# Patient Record
Sex: Female | Born: 1985 | Hispanic: Yes | Marital: Married | Smoking: Never smoker
Health system: Southern US, Community
[De-identification: ages and names within clinical notes are randomized; demographics above are authoritative.]

## PROBLEM LIST (undated history)

## (undated) DIAGNOSIS — K819 Cholecystitis, unspecified: Secondary | ICD-10-CM

## (undated) DIAGNOSIS — O24419 Gestational diabetes mellitus in pregnancy, unspecified control: Secondary | ICD-10-CM

---

## 2015-12-11 ENCOUNTER — Other Ambulatory Visit (HOSPITAL_COMMUNITY): Payer: Self-pay | Admitting: Family Medicine

## 2015-12-11 ENCOUNTER — Ambulatory Visit
Admission: RE | Admit: 2015-12-11 | Discharge: 2015-12-11 | Disposition: A | Discharge status: Home / Self Care | Payer: Self-pay | Source: Ambulatory Visit | Attending: Family Medicine | Admitting: Family Medicine

## 2015-12-11 DIAGNOSIS — R7611 Nonspecific reaction to tuberculin skin test without active tuberculosis: Secondary | ICD-10-CM

## 2016-03-15 HISTORY — PX: CHOLECYSTECTOMY: SHX55

## 2016-06-18 DIAGNOSIS — O365911 Maternal care for other known or suspected poor fetal growth, first trimester, fetus 1: Secondary | ICD-10-CM | POA: Insufficient documentation

## 2017-01-05 ENCOUNTER — Encounter: Payer: Self-pay | Admitting: Emergency Medicine

## 2017-01-05 ENCOUNTER — Emergency Department: Payer: Self-pay

## 2017-01-05 ENCOUNTER — Emergency Department
Admission: EM | Admit: 2017-01-05 | Discharge: 2017-01-06 | Disposition: A | Discharge status: Home / Self Care | Payer: Self-pay | Attending: Emergency Medicine | Admitting: Emergency Medicine

## 2017-01-05 DIAGNOSIS — R1011 Right upper quadrant pain: Secondary | ICD-10-CM

## 2017-01-05 DIAGNOSIS — K8050 Calculus of bile duct without cholangitis or cholecystitis without obstruction: Secondary | ICD-10-CM | POA: Insufficient documentation

## 2017-01-05 DIAGNOSIS — K8020 Calculus of gallbladder without cholecystitis without obstruction: Secondary | ICD-10-CM | POA: Insufficient documentation

## 2017-01-05 HISTORY — DX: Gestational diabetes mellitus in pregnancy, unspecified control: O24.419

## 2017-01-05 LAB — COMPREHENSIVE METABOLIC PANEL
ALT: 50 U/L (ref 14–54)
AST: 92 U/L — AB (ref 15–41)
Albumin: 4.2 g/dL (ref 3.5–5.0)
Alkaline Phosphatase: 158 U/L — ABNORMAL HIGH (ref 38–126)
Anion gap: 10 (ref 5–15)
BUN: 18 mg/dL (ref 6–20)
CHLORIDE: 101 mmol/L (ref 101–111)
CO2: 27 mmol/L (ref 22–32)
Calcium: 9.4 mg/dL (ref 8.9–10.3)
Creatinine, Ser: 0.53 mg/dL (ref 0.44–1.00)
GFR calc Af Amer: >60 mL/min (ref >60)
GFR calc non Af Amer: >60 mL/min (ref >60)
Glucose, Bld: 123 mg/dL — ABNORMAL HIGH (ref 65–99)
POTASSIUM: 3.8 mmol/L (ref 3.5–5.1)
SODIUM: 138 mmol/L (ref 135–145)
Total Bilirubin: 0.4 mg/dL (ref 0.3–1.2)
Total Protein: 7.9 g/dL (ref 6.5–8.1)

## 2017-01-05 LAB — URINALYSIS, COMPLETE (UACMP) WITH MICROSCOPIC
BILIRUBIN URINE: NEGATIVE
Bacteria, UA: NONE SEEN
GLUCOSE, UA: NEGATIVE mg/dL
HGB URINE DIPSTICK: NEGATIVE
KETONES UR: NEGATIVE mg/dL
LEUKOCYTES UA: NEGATIVE
NITRITE: NEGATIVE
PH: 6 (ref 5.0–8.0)
Protein, ur: NEGATIVE mg/dL
RBC / HPF: NONE SEEN RBC/hpf (ref 0–5)
SPECIFIC GRAVITY, URINE: 1.023 (ref 1.005–1.030)
Squamous Epithelial / LPF: NONE SEEN
WBC, UA: NONE SEEN WBC/hpf (ref 0–5)

## 2017-01-05 LAB — CBC
HEMATOCRIT: 40.5 % (ref 35.0–47.0)
Hemoglobin: 13.4 g/dL (ref 12.0–16.0)
MCH: 29 pg (ref 26.0–34.0)
MCHC: 33.1 g/dL (ref 32.0–36.0)
MCV: 87.6 fL (ref 80.0–100.0)
Platelets: 288 10*3/uL (ref 150–440)
RBC: 4.63 MIL/uL (ref 3.80–5.20)
RDW: 13.4 % (ref 11.5–14.5)
WBC: 15.9 10*3/uL — AB (ref 3.6–11.0)

## 2017-01-05 LAB — POCT PREGNANCY, URINE: Preg Test, Ur: NEGATIVE

## 2017-01-05 LAB — LIPASE, BLOOD: LIPASE: 28 U/L (ref 11–51)

## 2017-01-05 NOTE — ED Triage Notes (Signed)
Pt arrived via ems from home with complaints of abdominal pain and nausea. Pt was diagnosed with "gallbladder problems" 6 years ago but states she never followed up with surgeon. Pt states Monday her back began to hurt and today her stomach started to hurt. EMS reported to a call earlier today but pt did not want to come to the ER. The pain intensified tonight prompting her arrival to the ED. PT was given 4mg  of zofran in route/.

## 2017-01-05 NOTE — ED Notes (Signed)
Patient transported to Ultrasound 

## 2017-01-05 NOTE — ED Provider Notes (Signed)
[facility] Emergency Department Provider Note  Time seen: 10:35 PM  I have reviewed the triage vital signs and the nursing notes.   HISTORY  Chief Complaint Abdominal Pain    HPI Rhonda Larson is a 31 y.o. female with no past medical history who presents to the emergency department for upper abdominal pain.  According to the patient 6 years ago she was diagnosed with possible gallbladder stones but never followed up.  States over the past 6 years she has intermittently been experiencing pain when she eats but nothing like her current episode.  She states that 3 days ago she developed upper abdominal pain which has progressively worsened until tonight when it became severe 10 out of 10.  Patient called EMS and they brought her to the emergency department.  However upon arrival she states her pain has largely diminished and currently says it is a 0/10.  Patient states nausea but denies vomiting.  Denies fever, diarrhea, dysuria.    Past Medical History:  Diagnosis Date  . Gestational diabetes     There are no active problems to display for this patient.   History reviewed. No pertinent surgical history.  Prior to Admission medications   Not on File    Not on File  No family history on file.  Social History Social History  Substance Use Topics  . Smoking status: Never Smoker  . Smokeless tobacco: Never Used  . Alcohol use No    Review of Systems Constitutional: Negative for fever. Cardiovascular: Negative for chest pain. Respiratory: Negative for shortness of breath. Gastrointestinal: Severe upper abdominal cramping leg pain, now largely resolved.  Positive for nausea but negative for vomiting or diarrhea Genitourinary: Negative for dysuria. Musculoskeletal: Negative for back pain. All other ROS negative  ____________________________________________   PHYSICAL EXAM:  VITAL SIGNS: ED Triage Vitals  Enc Vitals Group     BP  01/05/17 2200 125/77     Pulse Rate 01/05/17 2200 77     Resp 01/05/17 2200 15     Temp 01/05/17 2200 98.1 F (36.7 C)     Temp Source 01/05/17 2200 Oral     SpO2 01/05/17 2200 98 %     Weight 01/05/17 2159 133 lb (60.3 kg)     Height 01/05/17 2159 5\' 3"  (1.6 m)     Head Circumference --      Peak Flow --      Pain Score 01/05/17 2159 9     Pain Loc --      Pain Edu? --      Excl. in GC? --     Constitutional: Alert and oriented. Well appearing and in no distress. Eyes: Normal exam ENT   Head: Normocephalic and atraumatic   Mouth/Throat: Mucous membranes are moist. Cardiovascular: Normal rate, regular rhythm. No murmur Respiratory: Normal respiratory effort without tachypnea nor retractions. Breath sounds are clear Gastrointestinal: Soft, moderate epigastric and right upper quadrant tenderness to palpation.  No rebound or guarding.  No distention. Musculoskeletal: Nontender with normal range of motion in all extremities Neurologic:  Normal speech and language. No gross focal neurologic deficits Skin:  Skin is warm, dry and intact.  Psychiatric: Mood and affect are normal.  ____________________________________________    RADIOLOGY  Ultrasound pending  ____________________________________________   INITIAL IMPRESSION / ASSESSMENT AND PLAN / ED COURSE  Pertinent labs & imaging results that were available during my care of the patient were reviewed by me and considered in my medical decision  making (see chart for details).  Patient presents to the emergency department for upper abdominal pain over the past 3 days progressively worsening although now resolved.  Differential would include pancreatitis, gallbladder disease, gastritis, ulcers, colitis.  We will check labs including LFTs and lipase.  We will proceed with a right upper quadrant ultrasound.  I discussed pain control with the patient she does not wish for any pain medication at this time.  I discussed this plan  of care with the patient and she is agreeable.  Record review including her care everywhere chart is largely noncontributory to today's visit I do not see any prior record of gallbladder disease.  White blood cell count is elevated at 15,000, AST and alkaline phosphatase are also elevated.  Lipase and bilirubin are normal.  Right upper quadrant ultrasound pending, patient care signed out to Dr. Dolores FrameSung.    ____________________________________________   FINAL CLINICAL IMPRESSION(S) / ED DIAGNOSES  Upper abdominal pain    Minna AntisPaduchowski, Jisel Fleet, MD 01/05/17 2256

## 2017-01-06 MED ORDER — AMOXICILLIN-POT CLAVULANATE 875-125 MG PO TABS
ORAL_TABLET | ORAL | Status: AC
  Filled 2017-01-06: qty 1

## 2017-01-06 MED ORDER — AMOXICILLIN-POT CLAVULANATE 875-125 MG PO TABS
1.0000 | ORAL_TABLET | Freq: Once | ORAL | Status: AC
  Administered 2017-01-06: 1 via ORAL
  Filled 2017-01-06: qty 1

## 2017-01-06 MED ORDER — ONDANSETRON 4 MG PO TBDP
4.0000 mg | ORAL_TABLET | Freq: Once | ORAL | Status: AC
  Administered 2017-01-06: 4 mg via ORAL
  Filled 2017-01-06: qty 1

## 2017-01-06 MED ORDER — AMOXICILLIN-POT CLAVULANATE 875-125 MG PO TABS: 1.0000 | ORAL_TABLET | Freq: Two times a day (BID) | ORAL | 0 refills | Status: DC

## 2017-01-06 MED ORDER — HYDROCODONE-ACETAMINOPHEN 5-325 MG PO TABS
1.0000 | ORAL_TABLET | Freq: Once | ORAL | Status: AC
  Administered 2017-01-06: 1 via ORAL
  Filled 2017-01-06: qty 1

## 2017-01-06 MED ORDER — HYDROCODONE-ACETAMINOPHEN 5-325 MG PO TABS: 1.0000 | ORAL_TABLET | Freq: Four times a day (QID) | ORAL | 0 refills | Status: DC | PRN

## 2017-01-06 MED ORDER — ONDANSETRON 4 MG PO TBDP: 4.0000 mg | ORAL_TABLET | Freq: Three times a day (TID) | ORAL | 0 refills | Status: DC | PRN

## 2017-01-06 NOTE — Discharge Instructions (Signed)
1.  Take antibiotic as prescribed (Augmentin 875 mg twice daily x 7 days). 2.  You may take pain and nausea medicines as needed (Norco/Zofran #30).  Pump and dump the first ounce of your breast milk before feeding your baby if you are taking Norco. 3.  Eat a bland diet for the next week then slowly advance as tolerated. 4. Return to the ER for worsening symptoms, persistent vomiting, fever or other concerns.

## 2017-01-06 NOTE — ED Provider Notes (Signed)
-----------------------------------------   12:33 AM on 01/06/2017 -----------------------------------------  RUQ US interpreted per Dr. Jake SamplesFujinaga: Contracted gallbladder with multiple shadowing stones and mild wall  thickening but negative sonographic Murphy. No biliary dilatation.   Using interpreter services, updated patient and family members of ultrasound results.  Discussed with Dr. Aleen CampiPiscoya from general surgery who recommends adding Augmentin, discharge home with close follow-up in clinic.  Strict return precautions given.  All verbalize understanding and agree with plan of care.   Irean HongSung, Jade J, MD 01/06/17 [phone]

## 2017-01-13 ENCOUNTER — Other Ambulatory Visit: Payer: Self-pay

## 2017-01-17 ENCOUNTER — Encounter: Payer: Self-pay | Admitting: Surgery

## 2017-01-17 ENCOUNTER — Ambulatory Visit (INDEPENDENT_AMBULATORY_CARE_PROVIDER_SITE_OTHER): Payer: Self-pay | Admitting: Surgery

## 2017-01-17 VITALS — BP 112/74 | HR 66 | Temp 97.4°F | Ht 64.0 in | Wt 131.0 lb

## 2017-01-17 DIAGNOSIS — K8010 Calculus of gallbladder with chronic cholecystitis without obstruction: Secondary | ICD-10-CM

## 2017-01-17 NOTE — Patient Instructions (Signed)
Colecistectomía laparoscópica, cuidados posteriores  (Laparoscopic Cholecystectomy, Care After)  Estas indicaciones le proporcionan información acerca de cómo deberá cuidarse después del procedimiento. El médico también podrá darle instrucciones específicas. Comuníquese con el médico si tiene algún problema o tiene preguntas después del procedimiento.  CUIDADOS EN EL HOGAR  Cuidado de la incisión  · Siga las indicaciones del médico en lo que respecta al cuidado de los cortes de la cirugía (incisiones). Haga lo siguiente:  ? Lávese las manos con agua y jabón antes de cambiar las vendas (vendaje). Use un desinfectante para manos si no dispone de agua y jabón.  ? Cambie el vendaje como se lo haya indicado el médico.  ? No retire los puntos (suturas), el adhesivo para la piel o las tiras adhesivas. Tal vez deban dejarse puestos en la piel durante 2 semanas o más tiempo. Si las tiras adhesivas se despegan y se enroscan, puede recortar los bordes sueltos. No retire las tiras adhesivas por completo a menos que el médico lo autorice.  · No tome baños de inmersión, no practique natación ni use el jacuzzi hasta que el médico lo autorice. Pregúntele al médico si puede ducharse. Tal vez solo le permitan darse baños de esponja.  Instrucciones generales  · Tome los medicamentos de venta libre y los recetados solamente como se lo haya indicado el médico.  · No conduzca ni use maquinaria pesada mientras toma analgésicos recetados.  · Reanude la dieta normal como se lo haya indicado el médico.  · No levante ningún objeto que pese más de 10 libras (4,5 kg).  · No practique deportes de contacto durante 1 semana o hasta que el médico lo autorice.  SOLICITE AYUDA SI:  · Tiene enrojecimiento, hinchazón o dolor en el lugar de los cortes quirúrgicos.  · Tiene secreción de líquido, sangre o pus que emana de los cortes.  · Percibe que sale mal olor de la zona de las incisiones.  · Los cortes quirúrgicos se abren.  · Tiene fiebre.    SOLICITE  AYUDA DE INMEDIATO SI:  · Tiene una erupción cutánea.  · Tiene dificultad para respirar.  · Siente dolor en el pecho.  · Siente que el dolor en los hombros (en la zona donde van los breteles) empeora.  · Se desvanece (se desmaya) o se marea mientras está de pie.  · Tiene dolor muy intenso de vientre (abdomen).  · Tiene malestar estomacal (náuseas) o vomita durante más de 1 día.    Esta información no tiene como fin reemplazar el consejo del médico. Asegúrese de hacerle al médico cualquier pregunta que tenga.  Document Released: 11/11/2010 Document Revised: 11/20/2014 Document Reviewed: 08/18/2015  Elsevier Interactive Patient Education © 2017 [employer]vier Inc[employer]

## 2017-01-18 ENCOUNTER — Encounter: Payer: Self-pay | Admitting: Surgery

## 2017-01-18 NOTE — Progress Notes (Signed)
Patient ID: Rhonda Larson, female   DOB: 11/08/1985, [name]1 y.o.   MRN: [id number]  HPI Rhonda Larson is a 31 y.o. female date in consultation at the request of Dr. Dolores FrameSung. She reports having intermittent right upper quadrant epigastric pain for the last several years. More recently about 10 days ago had a severe episode that was severe in nature and particularly the emergency room. She reported that that pain at that time was 10 out of 10, sharp and midline located in the right upper quadrant and radiates to the back. Worsening with fatty meals. No fevers no chills no evidence of biliary obstruction. She noticed that over the last 6 years she's been having intermittent pain that gets worse with fatty meals that was not as severe as the one that occurred 10 days ago. Ultrasound personally reviewed showing a gallbladder filled with stones. Normal common bile duct. She did have an alkaline phosphatase slightly elevated at 152 and AST of 92. Her white count was 15 k. She was treated with antibiotic therapy with resolution of symptoms. She currently has no pain She is able to perform more than 4 Mets of activity without any shortness of breath or chest pain  HPI  Past Medical History:  Diagnosis Date  . Gestational diabetes     No past surgical history on file.  Family History  Problem Relation Age of Onset  . Diabetes Mother     Social History Social History   Tobacco Use  . Smoking status: Never Smoker  . Smokeless tobacco: Never Used  Substance Use Topics  . Alcohol use: No  . Drug use: No    No Known Allergies  Current Outpatient Medications  Medication Sig Dispense Refill  . ondansetron (ZOFRAN ODT) 4 MG disintegrating tablet Take 1 tablet (4 mg total) by mouth every 8 (eight) hours as needed for nausea or vomiting. 30 tablet 0   No current facility-administered medications for this visit.      Review of Systems Full ROS  was asked and was negative except for  the information on the HPI  Physical Exam Blood pressure 112/74, pulse 66, temperature (!) 97.4 F (36.3 C), temperature source Oral, height 5\' 4"  (1.626 m), weight 59.4 kg (131 lb), unknown if currently breastfeeding. CONSTITUTIONAL: NAD EYES: Pupils are equal, round, and reactive to light, Sclera are non-icteric. EARS, NOSE, MOUTH AND THROAT: The oropharynx is clear. The oral mucosa is pink and moist. Hearing is intact to voice. LYMPH NODES:  Lymph nodes in the neck are normal. RESPIRATORY:  Lungs are clear. There is normal respiratory effort, with equal breath sounds bilaterally, and without pathologic use of accessory muscles. CARDIOVASCULAR: Heart is regular without murmurs, gallops, or rubs. GI: The abdomen is  soft, nontender, and nondistended. There are no palpable masses. There is no hepatosplenomegaly. There are normal bowel sounds in all quadrants. GU: Rectal deferred.   MUSCULOSKELETAL: Normal muscle strength and tone. No cyanosis or edema.   SKIN: Turgor is good and there are no pathologic skin lesions or ulcers. NEUROLOGIC: Motor and sensation is grossly normal. Cranial nerves are grossly intact. PSYCH:  Oriented to person, place and time. Affect is normal.  Data Reviewed  I have personally reviewed the patient's imaging, laboratory findings and medical records.    Assessment/Plan Chronic cholecystitis. Discussed with the patient in detail about my recommendation for cholecystectomy. I will also like to obtain a repeat CMP and based on the results we will determine whether or not she  will need a gram. The risks, benefits, complications, treatment options, and expected outcomes were discussed with the patient. The possibilities of bleeding, recurrent infection, finding a normal gallbladder, perforation of viscus organs, damage to surrounding structures, bile leak, abscess formation, needing a drain placed, the need for additional procedures, reaction to medication, pulmonary  aspiration,  failure to diagnose a condition, the possible need to convert to an open procedure, and creating a complication requiring transfusion or operation were discussed with the patient. The patient and/or family concurred with the proposed plan, giving informed consent.  A copy of the report will be sent to referring provider   Sterling Bigiego Akeelah Seppala, MD FACS General Surgeon 01/18/2017, 12:20 PM

## 2017-01-24 ENCOUNTER — Telehealth: Payer: Self-pay

## 2017-01-24 NOTE — Telephone Encounter (Signed)
Called patient to let her know that her Pre-Admit appointment is tomorrow 01/25/2017 at 10:15 AM at [facility] [address]. I told patient that an interpreter was going to be there to help her. Patient understood and had no questions.

## 2017-01-25 ENCOUNTER — Encounter
Admission: RE | Admit: 2017-01-25 | Discharge: 2017-01-25 | Disposition: A | Discharge status: Home / Self Care | Payer: Self-pay | Source: Ambulatory Visit | Attending: Surgery | Admitting: Surgery

## 2017-01-25 ENCOUNTER — Other Ambulatory Visit: Payer: Self-pay

## 2017-01-25 HISTORY — DX: Cholecystitis, unspecified: K81.9

## 2017-01-25 NOTE — Pre-Procedure Instructions (Signed)
Claretha CooperLoyda, spanish interpreter was here to accomodate the language needs of this patient. Her husband will also need an interpreter on the day of surgery. Questions answered and patient left comfortable with the information provided.

## 2017-01-25 NOTE — Patient Instructions (Signed)
Your procedure is scheduled on: Wednesday, February 02, 2017  Report to [facility]LO[facility]  To find out your arrival time please call [phone] between 1PM - 3PM on Tuesday, February 01, 2017 Remember: Instructions that are not followed completely may result in serious medical risk, up to and including death, or upon the discretion of your surgeon and anesthesiologist your surgery may need to be rescheduled.     _X__ 1. Do not eat food after midnight the night before your procedure.                 No gum chewing or hard candies. You may drink clear liquids up to 2 hours                 before you are scheduled to arrive for your surgery- DO not drink clear                 liquids within 2 hours of the start of your surgery.                 Clear Liquids include:  water, apple juice without pulp, clear carbohydrate                 drink such as Clearfast of Gartorade, Black Coffee or Tea (Do not add                 anything to coffee or tea).     _X__ 2.  No Alcohol for 24 hours before or after surgery.   _X__ 3.  Do Not Smoke or use e-cigarettes For 24 Hours Prior to Your Surgery.                 Do not use any chewable tobacco products for at least 6 hours prior to                 surgery.  ____  4.  Bring all medications with you on the day of surgery if instructed.   __X__  5.  Notify your doctor if there is any change in your medical condition      (cold, fever, infections).     Do not wear jewelry, make-up, hairpins, clips or nail polish. Do not wear lotions, powders, or perfumes. You may wear deodorant. Do not shave 48 hours prior to surgery. Men may shave face and neck. Do not bring valuables to the hospital.    [facility] is not responsible for any belongings or valuables.  Contacts, dentures or bridgework may not be worn into surgery. Leave your suitcase in the car. After surgery it may be brought to your room. For patients  admitted to the hospital, discharge time is determined by your treatment team.   Patients discharged the day of surgery will not be allowed to drive home.   Please read over the following fact sheets that you were given:   PREPARING FOR SURGERY          ____ Take these medicines the morning of surgery with A SIP OF WATER:    1. NONE!!  :):)  2.   3.   4.  5.  6.  ____ Fleet Enema (as directed)   __X__ Use CHG Soap as directed  MAKE SURE TO HAVE STOOL SOFTENERS AT HOME. YOU WILL TAKE THESE ALONG WITH PAIN MEDICATION (IT CAN BE VERY CONSTIPATING)

## 2017-01-31 ENCOUNTER — Telehealth: Payer: Self-pay | Admitting: Surgery

## 2017-01-31 ENCOUNTER — Telehealth: Payer: Self-pay

## 2017-01-31 NOTE — Telephone Encounter (Signed)
Patient applied for financial assistance and she was denied. She wants to cancel her surgery due to funds. She will call us in the future once she is able to finish paying her ED bills.

## 2017-01-31 NOTE — Telephone Encounter (Signed)
Patient has called and stated that she would like to cancel her surgery-Laparoscopic cholecystectomy with Dr Everlene FarrierPabon on 02/02/17 due to her not being approved [facility]. She will call back and reschedule once she reapplies.

## 2017-02-02 ENCOUNTER — Ambulatory Visit: Admission: RE | Admit: 2017-02-02 | Payer: Self-pay | Source: Ambulatory Visit | Admitting: Surgery

## 2017-02-02 ENCOUNTER — Encounter: Admission: RE | Payer: Self-pay | Source: Ambulatory Visit

## 2017-02-02 SURGERY — LAPAROSCOPIC CHOLECYSTECTOMY
Anesthesia: General

## 2017-06-02 IMAGING — CR DG CHEST 1V
1 series · 1 of 1 positions shown · non-contrast
Comparison: None.

CLINICAL DATA: 30-year-old who is approximately 10 weeks pregnant,
presenting with 1 week history of cough. Positive tuberculin skin
testing.

EXAM:
CHEST 1 VIEW

[w chest pa]
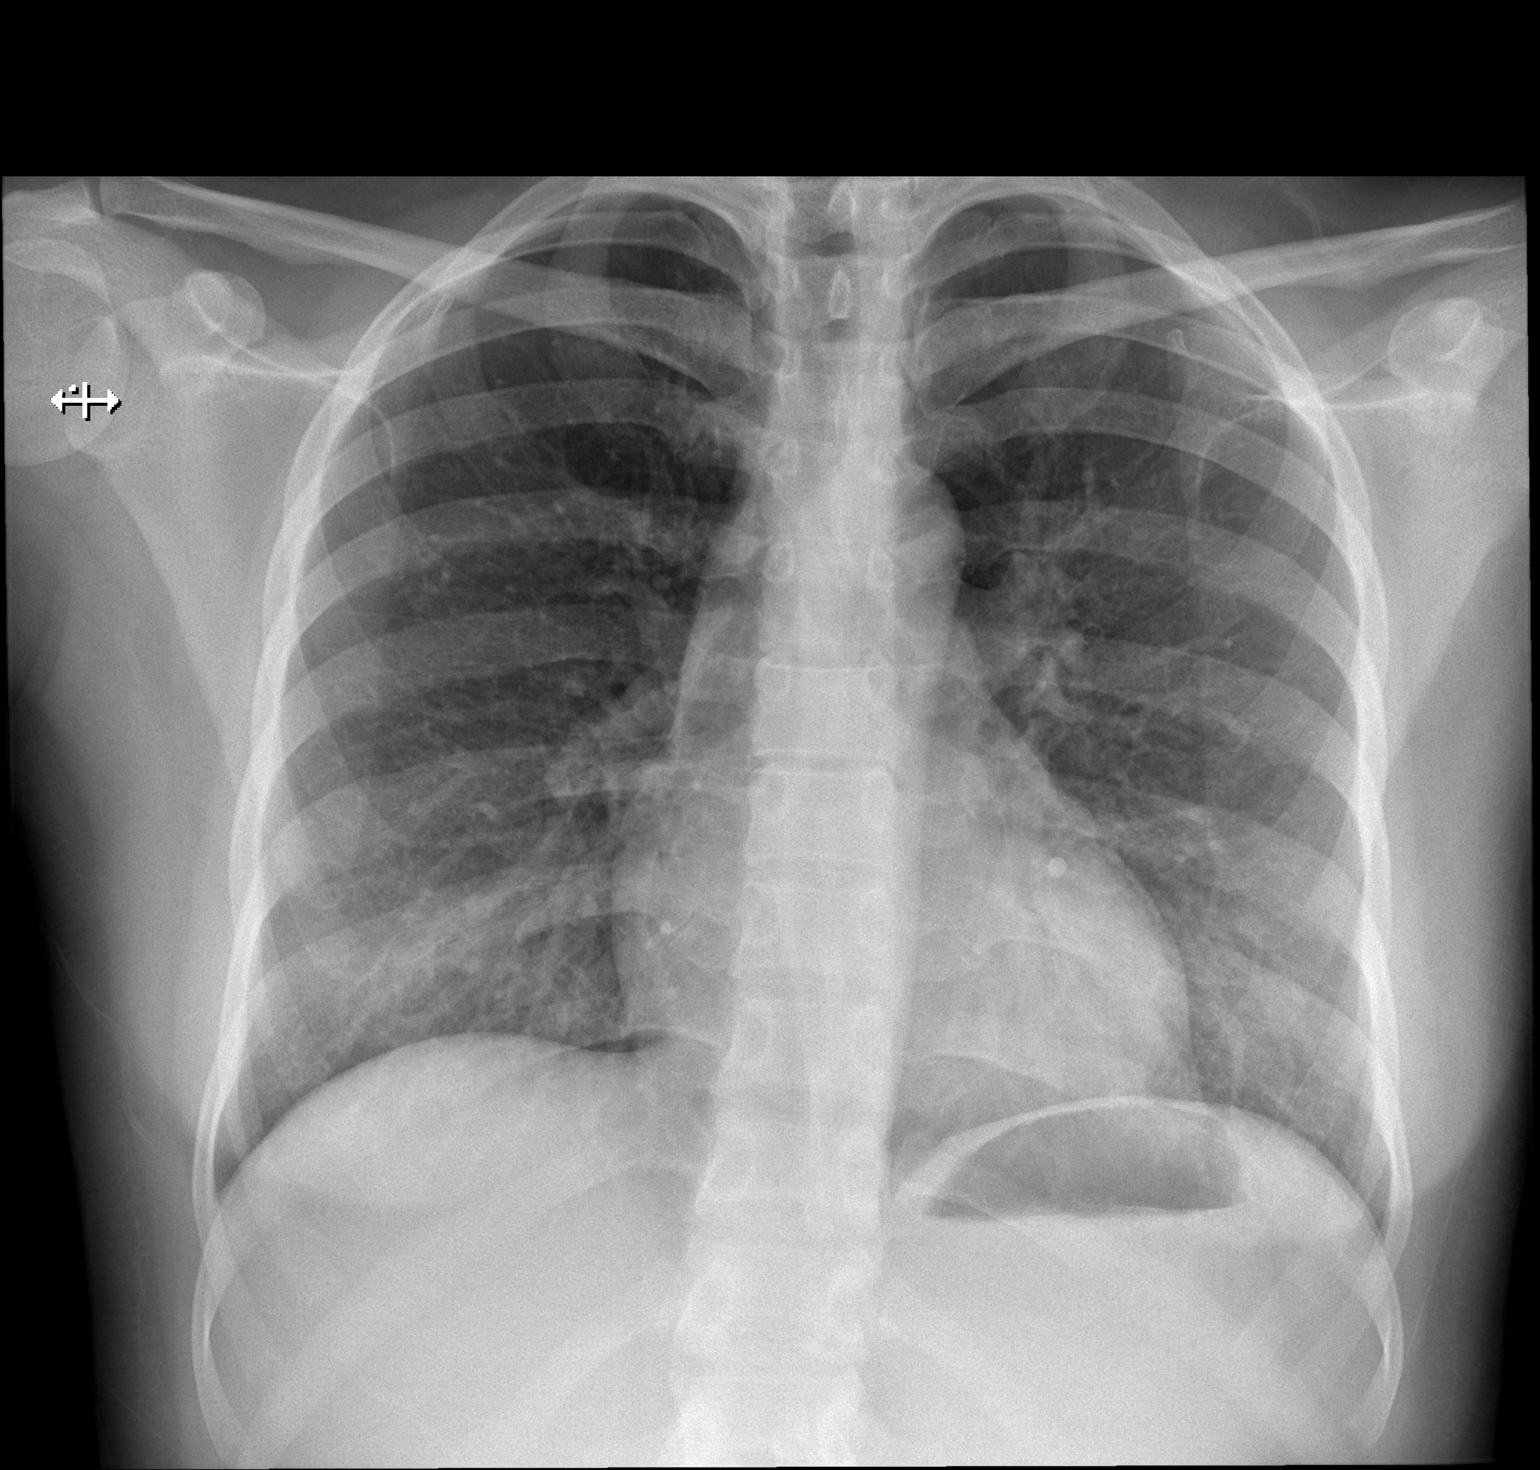

[1 of 1 positions shown; findings below may reference images not displayed]

FINDINGS: The patient was double shielded for the examination.
Cardiomediastinal silhouette unremarkable. Lungs clear.
Bronchovascular markings normal. Pulmonary vascularity normal. No
visible pleural effusions. No pneumothorax.
IMPRESSION: No acute cardiopulmonary disease.

## 2019-08-03 ENCOUNTER — Telehealth: Payer: Self-pay | Admitting: Family Medicine

## 2019-08-03 IMAGING — US US ABDOMEN LIMITED
1 series · 14 of 25 positions shown · non-contrast
Comparison: None.

CLINICAL DATA: Right upper quadrant pain with nausea

EXAM:
ULTRASOUND ABDOMEN LIMITED RIGHT UPPER QUADRANT

[Series 1: us abdomen limited · 0.19mm/px · 14 of 37 slices shown]
[im 1/37]
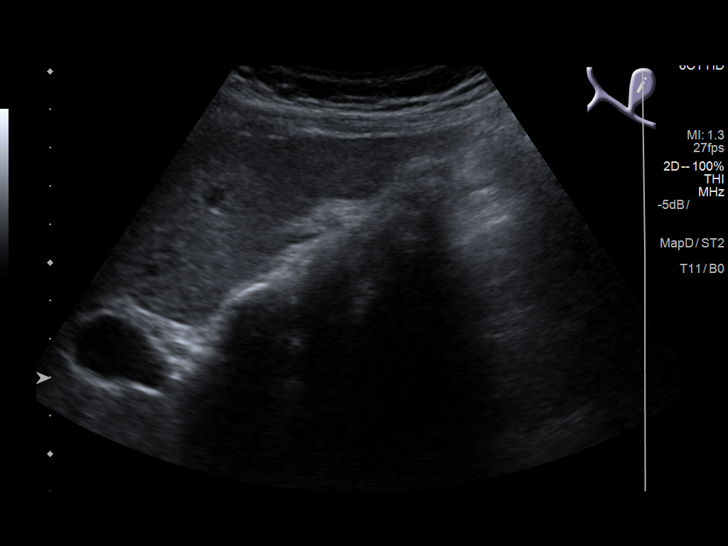
[im 4/37]
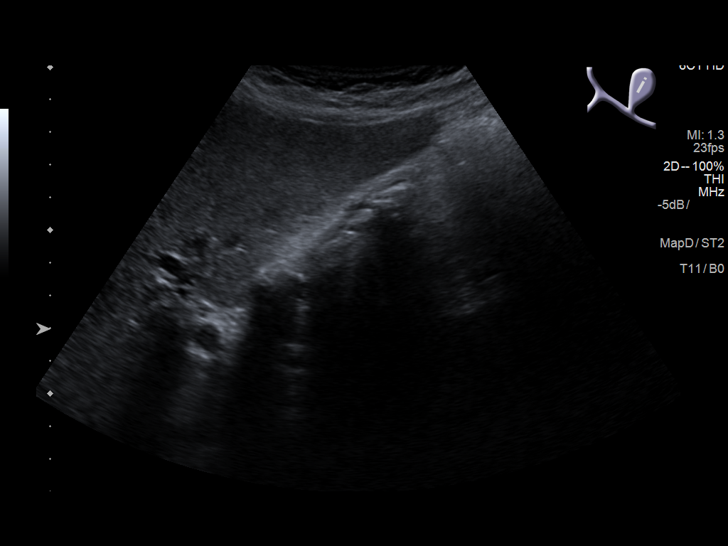
[im 7/37]
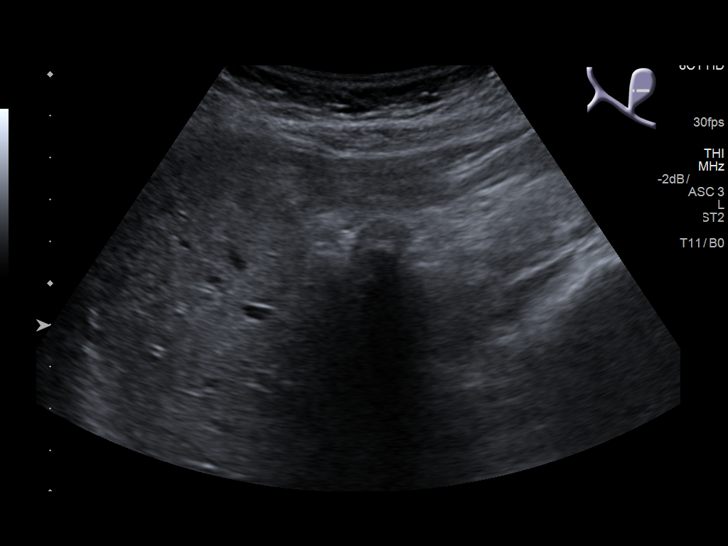
[im 10/37]
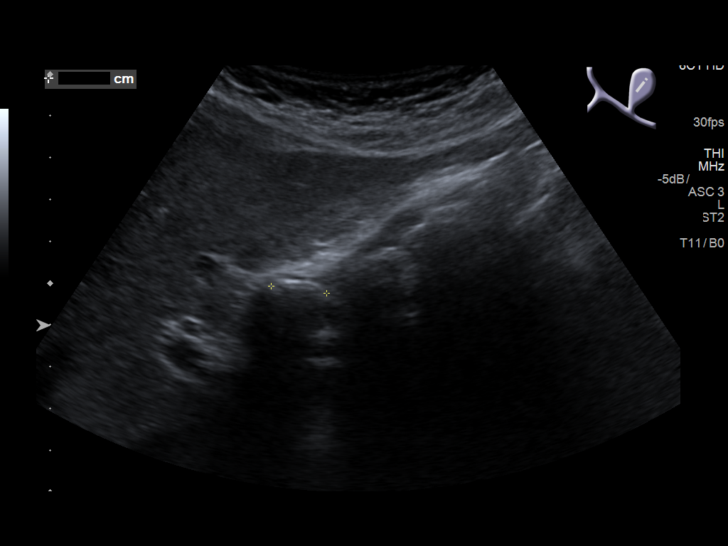
[im 13/37]
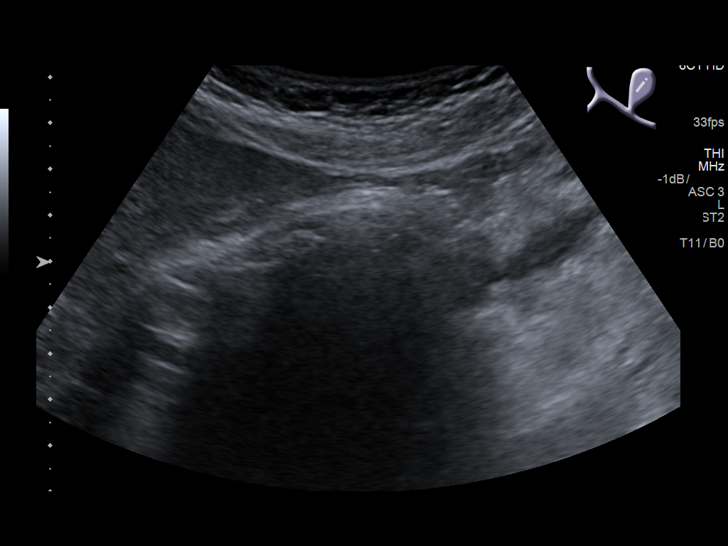
[im 14/37]
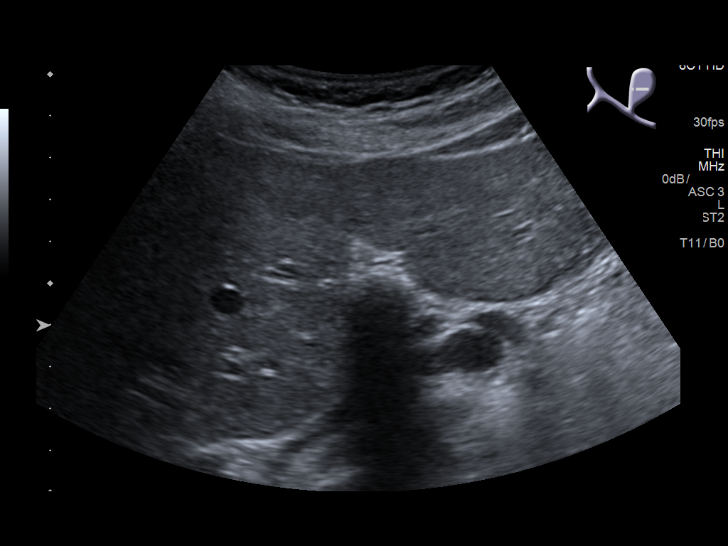
[im 17/37]
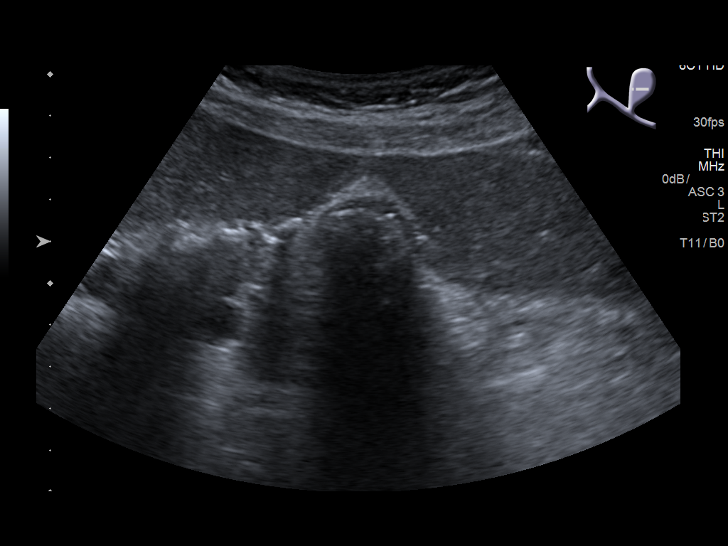
[im 20/37]
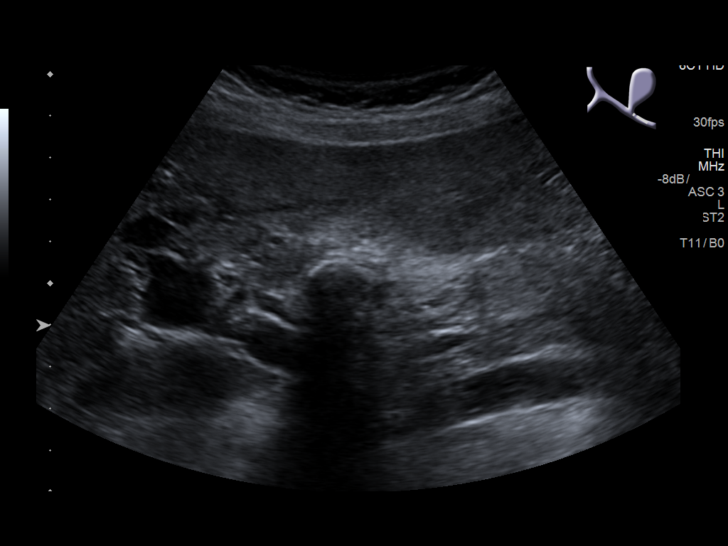
[im 23/37]
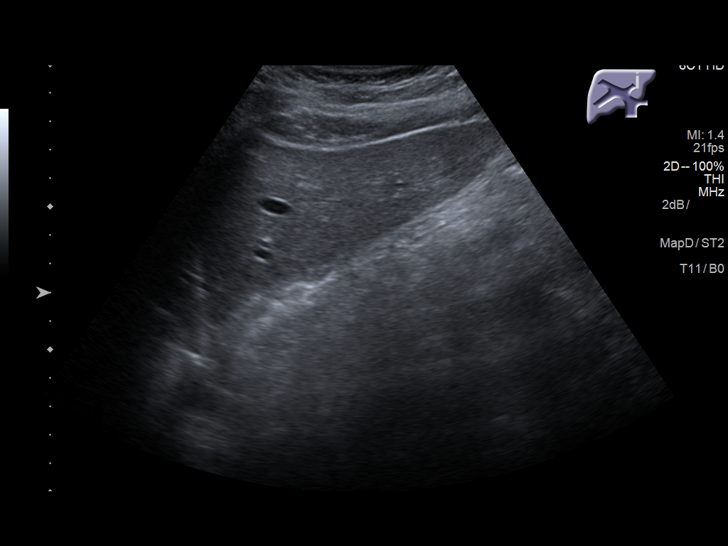
[im 25/37]
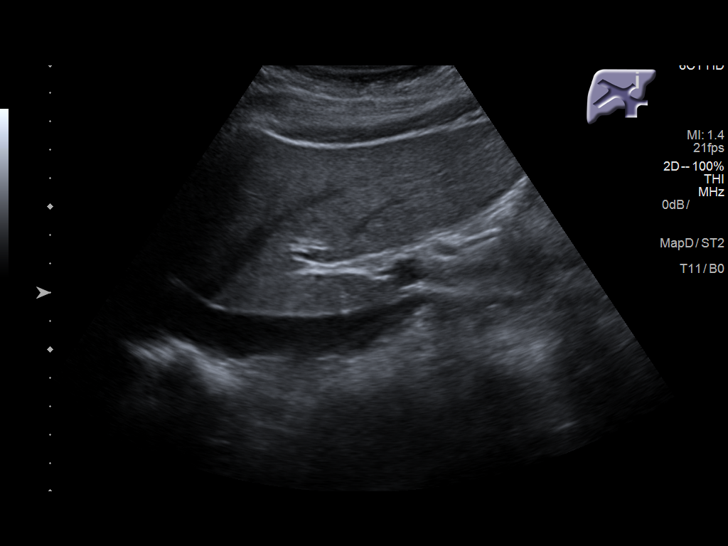
[im 28/37]
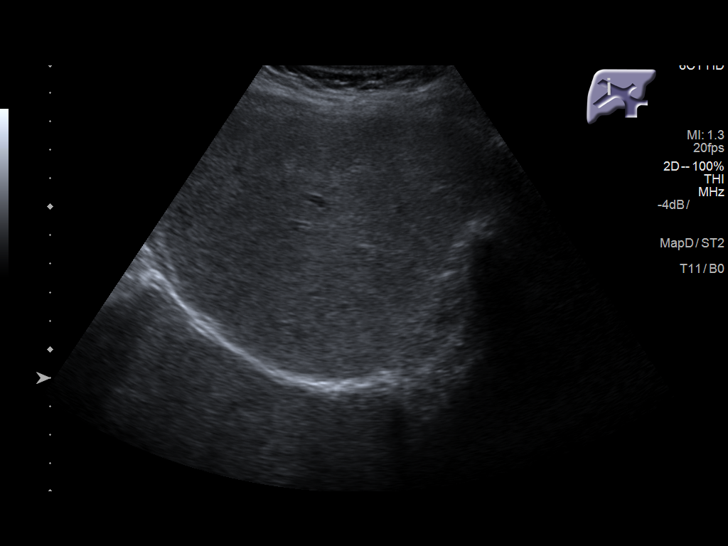
[im 31/37]
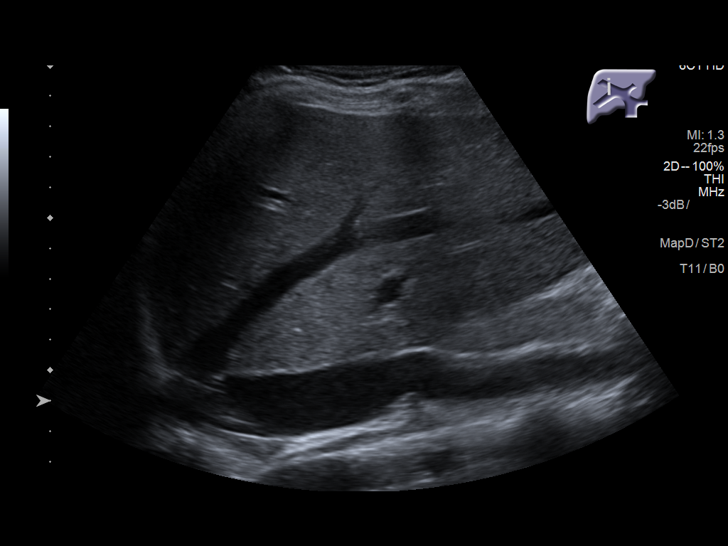
[im 34/37]
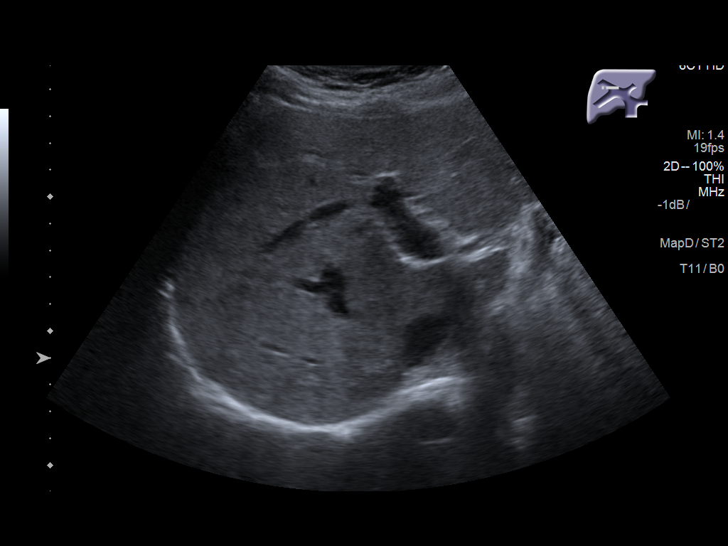
[im 37/37]
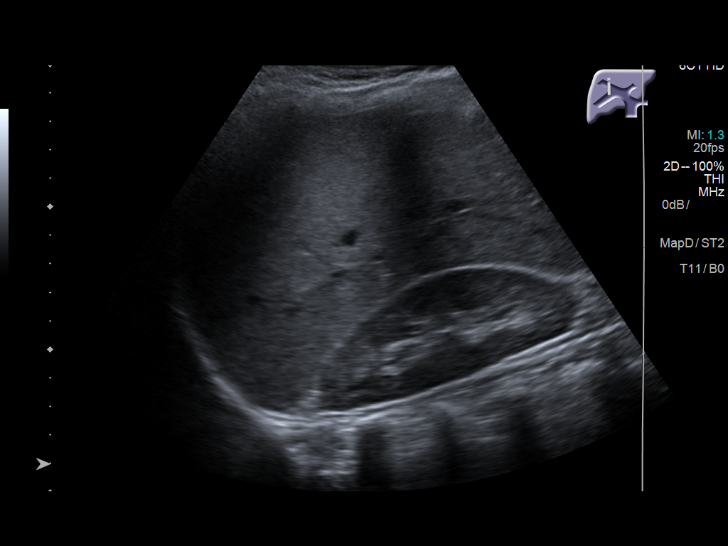

[14 of 25 positions shown; findings below may reference images not displayed]

FINDINGS: Gallbladder:

Contracted gallbladder filled with shadowing stones measuring up to
13 mm. Mild wall thickening at 4.2 mm. Negative sonographic Murphy.

Common bile duct:

Diameter: 2.9 mm

Liver:

No focal lesion identified. Within normal limits in parenchymal
echogenicity. Portal vein is patent on color Doppler imaging with
normal direction of blood flow towards the liver.
IMPRESSION: Contracted gallbladder with multiple shadowing stones and mild wall
thickening but negative sonographic Murphy. No biliary dilatation.

## 2019-08-03 NOTE — Telephone Encounter (Signed)
Patient would like Nexplanon removed and reinsertion.

## 2019-08-06 NOTE — Telephone Encounter (Signed)
Phone call to pt with interpreter Marlene Yemen. Left message on voicemail that RN with [facility] is returning phone call, if still need assistance please call [phone].

## 2019-08-07 NOTE — Telephone Encounter (Signed)
Phone call to pt with interpreter Marlene Yemen. Left message on voicemail that RN with [facility] is returning phone call, can call [phone] if still need assistance.

## 2019-08-07 NOTE — Telephone Encounter (Signed)
Phone call to pt with interpreter Salli Real. Left message on voicemail that RN with [facility] is returning phone call, if still need assistance call [phone].

## 2019-08-29 ENCOUNTER — Telehealth: Payer: Self-pay | Admitting: Family Medicine

## 2019-08-29 NOTE — Telephone Encounter (Signed)
patient needs nexplanon removed an reinserted and physical. Prefers calls in the afternoon. Needs interpreter

## 2019-08-30 NOTE — Telephone Encounter (Signed)
TC to patient with interpreter, V. Olmedo. Patient wants Nexplanon removal/reinsertion. Nexplanon inserted 08/10/2016 per [facility]. Patient's last PE was also 07/2016. Patient counseled she will need PE with appointment, and next Pap due 2022, per [facility] records. Patient scheduled for 09/07/2019 in 40 minute appointment slot for PE and removal/reinsertion of nexplanon.Burt Knack, RN

## 2019-09-07 ENCOUNTER — Ambulatory Visit (LOCAL_COMMUNITY_HEALTH_CENTER): Payer: Self-pay | Admitting: Advanced Practice Midwife

## 2019-09-07 ENCOUNTER — Other Ambulatory Visit: Payer: Self-pay

## 2019-09-07 ENCOUNTER — Encounter: Payer: Self-pay | Admitting: Advanced Practice Midwife

## 2019-09-07 VITALS — BP 100/62 | Ht 63.0 in | Wt 136.6 lb

## 2019-09-07 DIAGNOSIS — Z8632 Personal history of gestational diabetes: Secondary | ICD-10-CM | POA: Insufficient documentation

## 2019-09-07 DIAGNOSIS — Z30017 Encounter for initial prescription of implantable subdermal contraceptive: Secondary | ICD-10-CM

## 2019-09-07 DIAGNOSIS — Z3046 Encounter for surveillance of implantable subdermal contraceptive: Secondary | ICD-10-CM

## 2019-09-07 DIAGNOSIS — Z3009 Encounter for other general counseling and advice on contraception: Secondary | ICD-10-CM

## 2019-09-07 LAB — WET PREP FOR TRICH, YEAST, CLUE
Trichomonas Exam: NEGATIVE
Yeast Exam: NEGATIVE

## 2019-09-07 MED ORDER — ETONOGESTREL 68 MG ~~LOC~~ IMPL
68.0000 mg | DRUG_IMPLANT | Freq: Once | SUBCUTANEOUS | Status: AC
  Administered 2019-09-07: 17:00:00 68 mg via SUBCUTANEOUS

## 2019-09-07 NOTE — Progress Notes (Signed)
Wet mount reviewed and is negative today so no treatment needed for wet mount per standing order. PCP list given to pt. Provider orders completed.

## 2019-09-07 NOTE — Progress Notes (Signed)
Family Planning Visit- Initial Visit  Subjective:  Rhonda Larson is a 34 y.o. MHF nonsmoker G3P3000   being seen today for an initial well woman visit and to have Nexplanon removed and reinserted.  Rhonda Larson is currently using *Nexplanon for pregnancy prevention. Patient reports Rhonda Larson does not want a pregnancy in the next year.  Patient has the following medical conditions does not have any active problems on file.  Chief Complaint  Patient presents with  . Contraception    Physical, Nexplanon removal and reinsertion    Patient reports Nexplanon inserted 08/10/2016.  Last sex 09/03/19 with condom; with current partner x 14 years.  LMP 08/31/19.  Hx gestational diabetes 2018.  Not employed.  Last pap 06/19/2015 neg.  Last PE 07/2016.  Patient denies sxs of vaginitis but c/o vaginal pressure relieved with rest  Body mass index is 24.2 kg/m. - Patient is eligible for diabetes screening based on BMI and age >55?  no HA1C ordered? not applicable  Patient reports 1 of partners in last year. Desires STI screening?  No - no  Has patient been screened once for HCV in the past?  No  No results found for: HCVAB  Does the patient have current of drug use, have a partner with drug use, and/or has been incarcerated since last result? No  If yes-- Screen for HCV through [facility]   Does the patient meet criteria for HBV testing? No  Criteria:  -Household, sexual or needle sharing contact with HBV -History of drug use -HIV positive -Those with known Hep C   Health Maintenance Due  Topic Date Due  . Hepatitis C Screening  Never done  . HIV Screening  Never done  . PAP SMEAR-Modifier  Never done    Review of Systems  Genitourinary: Positive for frequency (x past 3 years; no dysuia).  Musculoskeletal: Positive for joint pain (left knee hurts x 4 months).  All other systems reviewed and are negative.   The following portions of the patient's history were reviewed and updated as  appropriate: allergies, current medications, past family history, past medical history, past social history, past surgical history and problem list. Problem list updated.   See flowsheet for other program required questions.  Objective:   Vitals:   09/07/19 1455  BP: 100/62  Weight: 136 lb 9.6 oz (62 kg)  Height: 5\' 3"  (1.6 m)    Physical Exam Constitutional:      Appearance: Normal appearance. Rhonda Larson is normal weight.  HENT:     Head: Normocephalic and atraumatic.     Mouth/Throat:     Mouth: Mucous membranes are moist.  Eyes:     Conjunctiva/sclera: Conjunctivae normal.  Cardiovascular:     Rate and Rhythm: Normal rate and regular rhythm.  Pulmonary:     Effort: Pulmonary effort is normal.     Breath sounds: Normal breath sounds.  Chest:     Breasts:        Right: Normal.        Left: Normal.  Abdominal:     General: Abdomen is flat.     Palpations: Abdomen is soft.     Comments: Soft without tenderness, good tone  Genitourinary:    General: Normal vulva.     Exam position: Lithotomy position.     Vagina: Vaginal discharge (white creamy leukorrhea, ph<4.5) present.     Cervix: Normal.     Uterus: Normal.      Adnexa: Right adnexa normal and left adnexa  normal.     Rectum: Normal.  Musculoskeletal:        General: Normal range of motion.     Cervical back: Normal range of motion and neck supple.  Skin:    General: Skin is warm and dry.  Neurological:     Mental Status: Rhonda Larson is alert.  Psychiatric:        Mood and Affect: Mood normal.       Assessment and Plan:  Charline Hoskinson is a 34 y.o. female presenting to the [facility] Department for an initial well woman exam/family planning visit  Contraception counseling: Reviewed all forms of birth control options in the tiered based approach. available including abstinence; over the counter/barrier methods; hormonal contraceptive medication including pill, patch, ring, injection,contraceptive  implant, ECP; hormonal and nonhormonal IUDs; permanent sterilization options including vasectomy and the various tubal sterilization modalities. Risks, benefits, and typical effectiveness rates were reviewed.  Questions were answered.  Written information was also given to the patient to review.  Patient desires Nexplanon, this was prescribed for patient. Rhonda Larson will follow up in prn for surveillance.  Rhonda Larson was told to call with any further questions, or with any concerns about this method of contraception.  Emphasized use of condoms 100% of the time for STI prevention.  Patient was offered ECP. ECP was not accepted by the patient. ECP counseling was not given - see RN documentation  1. Family planning Treat wet mount per standing orders Immunization nurse consult Please give list of primary care MD list to pt - etonogestrel (NEXPLANON) implant 68 mg - WET PREP FOR TRICH, YEAST, CLUE  2. Encounter for surveillance of implantable subdermal contraceptive Nexplanon Removal and Insertion  Patient identified, informed consent performed, consent signed.   Patient does understand that irregular bleeding is a very common side effect of this medication. Rhonda Larson was advised to have backup contraception for one week after replacement of the implant. Patient deemed to meet WHO criteria for being reasonably certain Rhonda Larson is not pregnant.  Appropriate time out taken. Nexplanon site identified. Area prepped in usual sterile fashon. 3 ml of 1% lidocaine with epinephrine was used to anesthetize the area at the distal end of the implant. A small stab incision was made right beside the implant on the distal portion. The Nexplanon rod was grasped using hemostats and removed without difficulty. There was minimal blood loss. There were no complications.   Confirmed correct location of insertion site. The insertion site was identified 8-10 cm (3-4 inches) from the medial epicondyle of the humerus and 3-5 cm (1.25-2 inches) posterior  to (below) the sulcus (groove) between the biceps and triceps muscles of the patient's left arm. New Nexplanon removed from packaging, Device confirmed in needle, then inserted full length of needle and withdrawn per handbook instructions. Nexplanon was able to palpated in the patient's left arm; patient palpated the insert herself.  There was minimal blood loss. Patient insertion site covered with guaze and a pressure bandage to reduce any bruising. The patient tolerated the procedure well and was given post procedure instructions.       Return if symptoms worsen or fail to improve.  No future appointments.  Alberteen Spindle, CNM

## 2019-09-07 NOTE — Progress Notes (Signed)
Pt here for physical, Nexplanon removal and reinsertion. Per centricity records Nexplanon was placed 08/10/2016. Pt reports using condoms with all sex for the past 2-3 weeks. RN counseling for Nexplanon removal, reinsertion completed and consent forms reviewed and signed by pt, and questions answered.

## 2019-11-07 ENCOUNTER — Telehealth: Payer: Self-pay | Admitting: Family Medicine

## 2019-11-08 NOTE — Telephone Encounter (Addendum)
Returned call to pt at phone # provided with language line interpreter [phone]). Pt had a Nexplanon placed 09/07/2019 and pt reports that she has had menstrual bleeding since a week after it was placed and is still having menstrual bleeding now. Pt reports menstrual flow is regular and that she goes through about 3 pads in a day. Pt denies any further symptoms. Counseled pt that I will speak with a provider to see what they would recommend for pt and will give pt a call back. Pt states understanding and that she is busy now, but will be available for a call back at around 4:30pm. Consulted with Landry Dyke, PA-C regarding conversation with pt and per provider pt can try Ibuprofen 600mg  every 6 hours with food for 5 days, and if it does not get better to give a call back so she can RTC. Will give pt a call back around 4:30pm per pt request to let pt know regarding [place], PA-C's recommendations.

## 2019-11-08 NOTE — Telephone Encounter (Signed)
Returned call to pt at phone # provided with interpretor from language line Lissa Hoard [phone]). Counseled pt per [facility] Streilein's, PA-C recommendations and pt states understanding. Pt states that she has an appt Tuesday 11/13/2019. Counseled pt that is fine if she keeps appt especially if the menstrual bleeding does not go away, but to give [place] a call if symptoms change or get worse and pt states understanding. Pt with no further questions at this time.

## 2019-11-13 ENCOUNTER — Ambulatory Visit: Payer: Self-pay

## 2019-11-13 ENCOUNTER — Other Ambulatory Visit: Payer: Self-pay

## 2019-11-13 ENCOUNTER — Ambulatory Visit (LOCAL_COMMUNITY_HEALTH_CENTER): Payer: Self-pay | Admitting: Advanced Practice Midwife

## 2019-11-13 ENCOUNTER — Encounter: Payer: Self-pay | Admitting: Advanced Practice Midwife

## 2019-11-13 VITALS — BP 103/67 | Ht 63.0 in | Wt 135.8 lb

## 2019-11-13 DIAGNOSIS — Z3009 Encounter for other general counseling and advice on contraception: Secondary | ICD-10-CM

## 2019-11-13 DIAGNOSIS — Z3046 Encounter for surveillance of implantable subdermal contraceptive: Secondary | ICD-10-CM

## 2019-11-13 NOTE — Progress Notes (Signed)
34 yo MHF G3P3 with Nexplanon removed and reinserted 09/07/19.  Pt called stating bleeding since 1 week after reinsertion and was told to take Ibuprofen 600 mg q 6 hrs x 5 days with no relief.  States brown d/c less than a menses but red when she voids.  Last PE 09/07/19.  Last pap 06/17/15 neg.  Discussed via interpreter, that this is normal and to be expected for 4-5 months after insertion. Pt reassured

## 2019-11-13 NOTE — Progress Notes (Signed)
Here today for complaint of continuous vaginal bleeding since insertion of Nexplanon 09/07/2019. Patient states she started having vaginal bleeding "a week after my appt." Has been taking Ibuprofen 600 mg Q6h x 5 days per nursing recommendations. Last PE here was 09/07/2019. Last Pap Smear was 06/17/2015. Per Cent[facility]ity next Pap due 2022. Tawny Hopping, RN

## 2020-08-07 ENCOUNTER — Telehealth: Payer: Self-pay | Admitting: Family Medicine

## 2020-08-07 NOTE — Telephone Encounter (Signed)
Spoke with Rhonda Larson.  Pt needs to be on a MVI and if she is not taking Ibuprofen, she can take 600 mg every 6 hours or 800 mg every 8 hours.  Pt needs to make a f/u appointment.  [facility] [employer] called and left a message for pt to return my call

## 2020-08-07 NOTE — Telephone Encounter (Signed)
Patient stated that she has been bleeding nonstop for awhile now and she doesn't think it's normal. Last time she came for the same reason and was told it was normal because of the nexplanon she's on. Patient also mention she has been feeling weak and dizzy. She said she doesn't want her nexplanon to be removed but wants something else done or given for her bleeding to stop.

## 2020-08-08 ENCOUNTER — Other Ambulatory Visit: Payer: Self-pay

## 2020-08-08 ENCOUNTER — Emergency Department: Payer: Self-pay

## 2020-08-08 ENCOUNTER — Emergency Department
Admission: EM | Admit: 2020-08-08 | Discharge: 2020-08-08 | Disposition: A | Discharge status: Home / Self Care | Payer: Self-pay | Attending: Emergency Medicine | Admitting: Emergency Medicine

## 2020-08-08 DIAGNOSIS — R42 Dizziness and giddiness: Secondary | ICD-10-CM | POA: Insufficient documentation

## 2020-08-08 DIAGNOSIS — R102 Pelvic and perineal pain: Secondary | ICD-10-CM | POA: Insufficient documentation

## 2020-08-08 DIAGNOSIS — N939 Abnormal uterine and vaginal bleeding, unspecified: Secondary | ICD-10-CM | POA: Insufficient documentation

## 2020-08-08 LAB — URINALYSIS, ROUTINE W REFLEX MICROSCOPIC
Bacteria, UA: NONE SEEN
Bilirubin Urine: NEGATIVE
Glucose, UA: NEGATIVE mg/dL
Ketones, ur: NEGATIVE mg/dL
Leukocytes,Ua: NEGATIVE
Nitrite: NEGATIVE
Protein, ur: 30 mg/dL — AB
RBC / HPF: >50 RBC/hpf — ABNORMAL HIGH (ref 0–5)
Specific Gravity, Urine: 1.029 (ref 1.005–1.030)
WBC, UA: NONE SEEN WBC/hpf (ref 0–5)
pH: 5 (ref 5.0–8.0)

## 2020-08-08 LAB — CBC
HCT: 40.3 % (ref 36.0–46.0)
Hemoglobin: 13.8 g/dL (ref 12.0–15.0)
MCH: 30.3 pg (ref 26.0–34.0)
MCHC: 34.2 g/dL (ref 30.0–36.0)
MCV: 88.6 fL (ref 80.0–100.0)
Platelets: 247 10*3/uL (ref 150–400)
RBC: 4.55 MIL/uL (ref 3.87–5.11)
RDW: 12.4 % (ref 11.5–15.5)
WBC: 6.6 10*3/uL (ref 4.0–10.5)
nRBC: 0 % (ref 0.0–0.2)

## 2020-08-08 LAB — COMPREHENSIVE METABOLIC PANEL
ALT: 66 U/L — ABNORMAL HIGH (ref 0–44)
AST: 52 U/L — ABNORMAL HIGH (ref 15–41)
Albumin: 4.2 g/dL (ref 3.5–5.0)
Alkaline Phosphatase: 66 U/L (ref 38–126)
Anion gap: 8 (ref 5–15)
BUN: 17 mg/dL (ref 6–20)
CO2: 24 mmol/L (ref 22–32)
Calcium: 8.7 mg/dL — ABNORMAL LOW (ref 8.9–10.3)
Chloride: 104 mmol/L (ref 98–111)
Creatinine, Ser: 0.68 mg/dL (ref 0.44–1.00)
GFR, Estimated: >60 mL/min (ref >60)
Glucose, Bld: 104 mg/dL — ABNORMAL HIGH (ref 70–99)
Potassium: 3.8 mmol/L (ref 3.5–5.1)
Sodium: 136 mmol/L (ref 135–145)
Total Bilirubin: 1 mg/dL (ref 0.3–1.2)
Total Protein: 7.8 g/dL (ref 6.5–8.1)

## 2020-08-08 LAB — PREGNANCY, URINE: Preg Test, Ur: NEGATIVE

## 2020-08-08 MED ORDER — SODIUM CHLORIDE 0.9 % IV BOLUS
1000.0000 mL | Freq: Once | INTRAVENOUS | Status: AC
  Administered 2020-08-08: 1000 mL via INTRAVENOUS

## 2020-08-08 MED ORDER — ONDANSETRON HCL 4 MG PO TABS
4.0000 mg | ORAL_TABLET | Freq: Once | ORAL | Status: AC
  Administered 2020-08-08: 4 mg via ORAL
  Filled 2020-08-08: qty 1

## 2020-08-08 NOTE — ED Notes (Signed)
ED Provider at bedside. 

## 2020-08-08 NOTE — ED Notes (Addendum)
Pt states Friday she had a fever. Pt unsure of temperature stating she didn't check it. States took medication for it. Pt now also c/o throat soreness and swallowing issues x 1 year.

## 2020-08-08 NOTE — ED Notes (Signed)
ED Provider at bedside. Interpreter on tablet used for assessment.

## 2020-08-08 NOTE — ED Notes (Signed)
Patient transported to Ultrasound 

## 2020-08-08 NOTE — Discharge Instructions (Signed)
Please follow-up with your OB/GYN regarding adenomyosis. Please increase hydration at home.

## 2020-08-08 NOTE — ED Provider Notes (Signed)
[facility]-EMERGENCY DEPARTMENT  ____________________________________________  Time seen: Approximately 8:51 PM  I have reviewed the triage vital signs and the nursing notes.   HISTORY  Chief Complaint No chief complaint on file.   Historian Patient     HPI Rhonda Larson is a 35 y.o. female presents to the emergency department with vaginal bleeding for the past month.  Patient states that she has a Nexplanon for birth control and that there is no possibility of pregnancy.  She states that she has had some dizziness at home.  Patient reports occasional pelvic pain.  No chest pain, chest tightness or nausea and vomiting at home.  No similar symptoms in the past.   Past Medical History:  Diagnosis Date  . Cholecystitis   . Gestational diabetes    nothing since delivery 06/2016     Immunizations up to date:  Yes.     Past Medical History:  Diagnosis Date  . Cholecystitis   . Gestational diabetes    nothing since delivery 06/2016    Patient Active Problem List   Diagnosis Date Noted  . History of gestational diabetes 2018 09/07/2019    History reviewed. No pertinent surgical history.  Prior to Admission medications   Medication Sig Start Date End Date Taking? Authorizing Provider  amoxicillin-clavulanate (AUGMENTIN) 875-125 MG tablet Take 1 tablet 2 (two) times daily by mouth. Patient not taking: Reported on 09/07/2019    [provider]  HYDROcodone-acetaminophen (NORCO/VICODIN) 5-325 MG tablet Take 1 tablet every 6 (six) hours as needed by mouth for moderate pain. Patient not taking: Reported on 09/07/2019    [provider]  ibuprofen (ADVIL) 600 MG tablet Take 600 mg by mouth every 6 (six) hours as needed.    [provider]  ondansetron (ZOFRAN ODT) 4 MG disintegrating tablet Take 1 tablet (4 mg total) by mouth every 8 (eight) hours as needed for nausea or vomiting. Patient not taking: Reported on 09/07/2019 01/06/17   Irean Hong,  MD    Allergies Patient has no known allergies.  Family History  Problem Relation Age of Onset  . Diabetes Mother   . Hypertension Maternal Grandmother   . Kidney disease Maternal Aunt     Social History Social History   Tobacco Use  . Smoking status: Never Smoker  . Smokeless tobacco: Never Used  Vaping Use  . Vaping Use: Never used  Substance Use Topics  . Alcohol use: No  . Drug use: No     Review of Systems  Constitutional: No fever/chills Eyes:  No discharge ENT: No upper respiratory complaints. Respiratory: no cough. No SOB/ use of accessory muscles to breath Gastrointestinal:   No nausea, no vomiting.  No diarrhea.  No constipation. Genitourinary: Patient has vaginal bleeding.  Musculoskeletal: Negative for musculoskeletal pain. Skin: Negative for rash, abrasions, lacerations, ecchymosis.    ____________________________________________   PHYSICAL EXAM:  VITAL SIGNS: ED Triage Vitals  Enc Vitals Group     BP 08/08/20 1715 (!) 90/51     Pulse Rate 08/08/20 1715 71     Resp 08/08/20 1715 19     Temp 08/08/20 1715 97.8 F (36.6 C)     Temp Source 08/08/20 1715 Oral     SpO2 08/08/20 1715 99 %     Weight 08/08/20 1717 145 lb (65.8 kg)     Height 08/08/20 1717 5\' 3"  (1.6 m)     Head Circumference --      Peak Flow --  Pain Score 08/08/20 1717 0     Pain Loc --      Pain Edu? --      Excl. in GC? --      Constitutional: Alert and oriented. Well appearing and in no acute distress. Eyes: Conjunctivae are normal. PERRL. EOMI. Head: Atraumatic. ENT: Cardiovascular: Normal rate, regular rhythm. Normal S1 and S2.  Good peripheral circulation. Respiratory: Normal respiratory effort without tachypnea or retractions. Lungs CTAB. Good air entry to the bases with no decreased or absent breath sounds Gastrointestinal: Bowel sounds x 4 quadrants. Soft and nontender to palpation. No guarding or rigidity. No distention. Musculoskeletal: Full range of  motion to all extremities. No obvious deformities noted Neurologic:  Normal for age. No gross focal neurologic deficits are appreciated.  Skin:  Skin is warm, dry and intact. No rash noted. Psychiatric: Mood and affect are normal for age. Speech and behavior are normal.   ____________________________________________   LABS (all labs ordered are listed, but only abnormal results are displayed)  Labs Reviewed  COMPREHENSIVE METABOLIC PANEL - Abnormal; Notable for the following components:      Result Value   Glucose, Bld 104 (*)    Calcium 8.7 (*)    AST 52 (*)    ALT 66 (*)    All other components within normal limits  URINALYSIS, ROUTINE W REFLEX MICROSCOPIC - Abnormal; Notable for the following components:   Color, Urine YELLOW (*)    APPearance HAZY (*)    Hgb urine dipstick LARGE (*)    Protein, ur 30 (*)    RBC / HPF >50 (*)    All other components within normal limits  CBC  PREGNANCY, URINE   ____________________________________________  EKG   ____________________________________________  RADIOLOGY Rhonda Larson, personally viewed and evaluated these images (plain radiographs) as part of my medical decision making, as well as reviewing the written report by the radiologist.    US PELVIC COMPLETE WITH TRANSVAGINAL  Result Date: 08/08/2020 CLINICAL DATA:  35 year old female with irregular bleeding x1 month. EXAM: TRANSABDOMINAL AND TRANSVAGINAL ULTRASOUND OF PELVIS TECHNIQUE: Both transabdominal and transvaginal ultrasound examinations of the pelvis were performed. Transabdominal technique was performed for global imaging of the pelvis including uterus, ovaries, adnexal regions, and pelvic cul-de-sac. It was necessary to proceed with endovaginal exam following the transabdominal exam to visualize the endometrium and ovaries. COMPARISON:  None FINDINGS: Uterus Measurements: 9.7 x 4.7 x 6.5 cm = volume: 155 mL. The uterus demonstrates a heterogeneous echotexture with  findings suggestive of adenomyosis. A 9 mm probable complex nabothian cyst in the lower uterus. Endometrium Thickness: 4 mm. Slight hyperemia of the endometrium. No focal lesion identified. Hysteroscopy or saline infused hysterosonography may provide better evaluation if there is clinical concern for an endometrial lesion such as a polyp. Right ovary Measurements: 4.2 x 1.8 x 2.7 cm = volume: 10.8 mL. Normal appearance/no adnexal mass. Left ovary Measurements: 4.1 x 1.8 x 1.8 cm = volume: 7.3 mL. Normal appearance/no adnexal mass. Other findings No abnormal free fluid. IMPRESSION: 1. Heterogeneous uterus with findings suggestive of adenomyosis. 2. Slightly hyperemic endometrium without a focal mass. 3. Unremarkable ovaries. Electronically Signed   By: Elgie Collard M.D.   On: 08/08/2020 22:11    ____________________________________________    PROCEDURES  Procedure(s) performed:     Procedures     Medications  ondansetron (ZOFRAN) tablet 4 mg (4 mg Oral Given 08/08/20 1731)  sodium chloride 0.9 % bolus 1,000 mL (0 mLs Intravenous Stopped 08/08/20  2325)     ____________________________________________   INITIAL IMPRESSION / ASSESSMENT AND PLAN / ED COURSE  Pertinent labs & imaging results that were available during my care of the patient were reviewed by me and considered in my medical decision making (see chart for details).      Assessment and Plan:  35 year old female presents to the emergency department with vaginal bleeding that has occurred for 1 month.  Patient was hypertensive at triage but vital signs were otherwise reassuring.  CBC indicated hemoglobin of 13.8.  CMP showed mild elevation in AST and ALT but were otherwise reassuring.  Urinalysis showed a large amount of blood but no other findings concerning for UTI.  Pelvic ultrasound was concerning for adenomyosis.  Patient was given a liter of normal saline and she reported that she felt improved after fluids.   Recommended increase hydration at home and follow-up with her OB/GYN.  Return precautions were given to return with new or worsening symptoms.  All patient questions were answered.   ____________________________________________  FINAL CLINICAL IMPRESSION(S) / ED DIAGNOSES  Final diagnoses:  Vaginal bleeding      NEW MEDICATIONS STARTED DURING THIS VISIT:  ED Discharge Orders    None          This chart was dictated using voice recognition software/Dragon. Despite best efforts to proofread, errors can occur which can change the meaning. Any change was purely unintentional.     Orvil Feil, PA-C 08/08/20 2328    Sharyn Creamer, MD 08/09/20 0010

## 2020-08-08 NOTE — ED Triage Notes (Signed)
Pt in from home d/t issue with birth control implant in arm; states has had excessive vaginal bleeding. Weakness and chills noted. Pt denies any history of anemia. Pt reports nausea and blood clots. Denies dizziness, vomiting, cramping. Interpreting system used during triage.

## 2020-08-08 NOTE — ED Notes (Signed)
Pt returned from US

## 2020-08-08 NOTE — ED Notes (Signed)
Pt given UA cup, states she cannot pee right now

## 2022-01-12 ENCOUNTER — Other Ambulatory Visit: Payer: Self-pay

## 2022-01-12 ENCOUNTER — Emergency Department
Admission: EM | Admit: 2022-01-12 | Discharge: 2022-01-12 | Disposition: A | Discharge status: Home / Self Care | Payer: Self-pay | Attending: Emergency Medicine | Admitting: Emergency Medicine

## 2022-01-12 ENCOUNTER — Emergency Department: Payer: Self-pay

## 2022-01-12 DIAGNOSIS — M25519 Pain in unspecified shoulder: Secondary | ICD-10-CM

## 2022-01-12 DIAGNOSIS — R0602 Shortness of breath: Secondary | ICD-10-CM | POA: Insufficient documentation

## 2022-01-12 DIAGNOSIS — Z1152 Encounter for screening for COVID-19: Secondary | ICD-10-CM | POA: Insufficient documentation

## 2022-01-12 DIAGNOSIS — R5383 Other fatigue: Secondary | ICD-10-CM | POA: Insufficient documentation

## 2022-01-12 DIAGNOSIS — M25512 Pain in left shoulder: Secondary | ICD-10-CM | POA: Insufficient documentation

## 2022-01-12 DIAGNOSIS — R079 Chest pain, unspecified: Secondary | ICD-10-CM | POA: Insufficient documentation

## 2022-01-12 LAB — COMPREHENSIVE METABOLIC PANEL
ALT: 11 U/L (ref 0–44)
AST: 17 U/L (ref 15–41)
Albumin: 4.1 g/dL (ref 3.5–5.0)
Alkaline Phosphatase: 64 U/L (ref 38–126)
Anion gap: 10 (ref 5–15)
BUN: 15 mg/dL (ref 6–20)
CO2: 20 mmol/L — ABNORMAL LOW (ref 22–32)
Calcium: 8.8 mg/dL — ABNORMAL LOW (ref 8.9–10.3)
Chloride: 108 mmol/L (ref 98–111)
Creatinine, Ser: 0.49 mg/dL (ref 0.44–1.00)
GFR, Estimated: >60 mL/min (ref >60)
Glucose, Bld: 93 mg/dL (ref 70–99)
Potassium: 3.6 mmol/L (ref 3.5–5.1)
Sodium: 138 mmol/L (ref 135–145)
Total Bilirubin: 0.7 mg/dL (ref 0.3–1.2)
Total Protein: 7.3 g/dL (ref 6.5–8.1)

## 2022-01-12 LAB — URINALYSIS, ROUTINE W REFLEX MICROSCOPIC
Bilirubin Urine: NEGATIVE
Glucose, UA: NEGATIVE mg/dL
Ketones, ur: 5 mg/dL — AB
Nitrite: NEGATIVE
Protein, ur: NEGATIVE mg/dL
Specific Gravity, Urine: 1.004 — ABNORMAL LOW (ref 1.005–1.030)
pH: 5 (ref 5.0–8.0)

## 2022-01-12 LAB — CBC
HCT: 38.5 % (ref 36.0–46.0)
Hemoglobin: 12.6 g/dL (ref 12.0–15.0)
MCH: 29.4 pg (ref 26.0–34.0)
MCHC: 32.7 g/dL (ref 30.0–36.0)
MCV: 89.7 fL (ref 80.0–100.0)
Platelets: 279 10*3/uL (ref 150–400)
RBC: 4.29 MIL/uL (ref 3.87–5.11)
RDW: 12.9 % (ref 11.5–15.5)
WBC: 8.6 10*3/uL (ref 4.0–10.5)
nRBC: 0 % (ref 0.0–0.2)

## 2022-01-12 LAB — RESP PANEL BY RT-PCR (FLU A&B, COVID) ARPGX2
Influenza A by PCR: NEGATIVE
Influenza B by PCR: NEGATIVE
SARS Coronavirus 2 by RT PCR: NEGATIVE

## 2022-01-12 LAB — PREGNANCY, URINE: Preg Test, Ur: NEGATIVE

## 2022-01-12 LAB — TROPONIN I (HIGH SENSITIVITY)
Troponin I (High Sensitivity): <2 ng/L (ref <18)
Troponin I (High Sensitivity): <2 ng/L (ref <18)

## 2022-01-12 MED ORDER — CYCLOBENZAPRINE HCL 10 MG PO TABS
5.0000 mg | ORAL_TABLET | Freq: Once | ORAL | Status: AC
  Administered 2022-01-12: 5 mg via ORAL
  Filled 2022-01-12: qty 1

## 2022-01-12 MED ORDER — ACETAMINOPHEN 500 MG PO TABS
1000.0000 mg | ORAL_TABLET | Freq: Once | ORAL | Status: AC
  Administered 2022-01-12: 1000 mg via ORAL
  Filled 2022-01-12: qty 2

## 2022-01-12 MED ORDER — ALUM & MAG HYDROXIDE-SIMETH 200-200-20 MG/5ML PO SUSP
30.0000 mL | Freq: Once | ORAL | Status: AC
  Administered 2022-01-12: 30 mL via ORAL
  Filled 2022-01-12: qty 30

## 2022-01-12 NOTE — ED Provider Notes (Addendum)
[facility] Provider Note    Event Date/Time   First MD Initiated Contact with Patient 01/12/22 1911     (approximate)   History   Chest Pain   HPI  Rhonda Larson is a 36 y.o. female with no significant past medical history presents with chest pain.  Patient symptoms started around 5 PM.  She started to feel generally unwell and then started having pain in her left shoulder radiating to her left neck and chest area.  She felt short of breath with this as well.  Has had ongoing pain since that time.  Nonpleuritic.  Pain does go down the arm.  She has had belching but no nausea or vomiting.  Has had multiple similar episodes of shoulder/chest pain in the last 6 months. The patient denies hx of prior DVT/PE, unilateral leg pain/swelling, hormone use, recent surgery, hx of cancer, prolonged immobilization, or hemoptysis.      Past Medical History:  Diagnosis Date   Cholecystitis    Gestational diabetes    nothing since delivery 06/2016    Patient Active Problem List   Diagnosis Date Noted   History of gestational diabetes 2018 09/07/2019     Physical Exam  Triage Vital Signs: ED Triage Vitals  Enc Vitals Group     BP 01/12/22 1850 109/65     Pulse Rate 01/12/22 1850 88     Resp 01/12/22 1850 18     Temp 01/12/22 1850 98 F (36.7 C)     Temp Source 01/12/22 1850 Oral     SpO2 01/12/22 1850 99 %     Weight --      Height --      Head Circumference --      Peak Flow --      Pain Score 01/12/22 1849 5     Pain Loc --      Pain Edu? --      Excl. in GC? --     Most recent vital signs: Vitals:   01/12/22 1850 01/12/22 2035  BP: 109/65 116/64  Pulse: 88 75  Resp: 18 18  Temp: 98 F (36.7 C) 97.9 F (36.6 C)  SpO2: 99% 98%     General: Awake, no distress.  CV:  Good peripheral perfusion. 2+ radial pulses BL, no peripheral edema, legs are symmetric  Resp:  Normal effort. No increased wob Abd:  No distention.  Abdomen is soft  nontender Neuro:             Awake, Alert, Oriented x 3  Other:  Tenderness to palpation over the left trap and left cervical paraspinal musculature 5/5 strength with elbow flexion extension, 4-5 strength with grip on the left compared to 5 out of 5 on the right   ED Results / Procedures / Treatments  Labs (all labs ordered are listed, but only abnormal results are displayed) Labs Reviewed  COMPREHENSIVE METABOLIC PANEL - Abnormal; Notable for the following components:      Result Value   CO2 20 (*)    Calcium 8.8 (*)    All other components within normal limits  URINALYSIS, ROUTINE W REFLEX MICROSCOPIC - Abnormal; Notable for the following components:   Color, Urine STRAW (*)    APPearance HAZY (*)    Specific Gravity, Urine 1.004 (*)    Hgb urine dipstick LARGE (*)    Ketones, ur 5 (*)    Leukocytes,Ua SMALL (*)    Bacteria, UA RARE (*)  All other components within normal limits  RESP PANEL BY RT-PCR (FLU A&B, COVID) ARPGX2  CBC  PREGNANCY, URINE  POC URINE PREG, ED  TROPONIN I (HIGH SENSITIVITY)  TROPONIN I (HIGH SENSITIVITY)     EKG  EKG interpretation performed by myself: NSR, nml axis, nml intervals, flattened T wave in V3, no acute ischemic changes    RADIOLOGY I reviewed and interpreted the CXR which does not show any acute cardiopulmonary process    PROCEDURES:  Critical Care performed: No  Procedures    MEDICATIONS ORDERED IN ED: Medications  alum & mag hydroxide-simeth (MAALOX/MYLANTA) 200-200-20 MG/5ML suspension 30 mL (30 mLs Oral Given 01/12/22 2025)  cyclobenzaprine (FLEXERIL) tablet 5 mg (5 mg Oral Given 01/12/22 2025)  acetaminophen (TYLENOL) tablet 1,000 mg (1,000 mg Oral Given 01/12/22 2025)     IMPRESSION / MDM / ASSESSMENT AND PLAN / ED COURSE  I reviewed the triage vital signs and the nursing notes.                              Patient's presentation is most consistent with acute presentation with potential threat to life or  bodily function.  Differential diagnosis includes, but is not limited to, GERD, dyspepsia, musculoskeletal pain, cervical radiculopathy, less likely acute coronary syndrome, pulmonary embolism  The patient is a 36 year old female is rather healthy presents because of chest and shoulder pain.  Symptoms started around 5:00 she endorses feeling generally unwell and fatigued and pain in the left shoulder that radiated to her neck and chest.  She continues to have the pain now.  She has had this pain off and on for 6 months.  Denies any injury does endorse some shortness of breath her vitals are reassuring and she is not hypoxic or tachycardic.  She looks well and there is focal tenderness really over the trap and paraspinal cervical musculature.  There is no findings to suggest DVT.  She does have somewhat decreased strength with grip on the left but does seem to be in pain as well has good strength with elbow flexion extension.  She is frequently belching abdomen is soft nontender.  Ultimately my suspicion for cardiac cause of her symptoms is low.  Her shoulder and chest pain seem to be more musculoskeletal and the fact that this has been going on intermittently for 6 months is reassuring also she has no cardiac risk factors and is young healthy and very low heart score and her high-sensitivity troponin is undetectable.  EKG is nonischemic.  Suspect either muscle spasm versus cervical radiculopathy.  Plan to treat with Tylenol GI cocktail given the belching as there could be a component of dyspepsia or GERD could be contributing to chest pain.  Will avoid NSAIDs use there is a underlying GI cause.  Of note urinalysis was collected had 11-20 WBCs but patient is not having any urinary symptoms do not feel it represents UTI.   Clinical Course as of 01/12/22 2302  Tue Jan 12, 2022  2236 Troponin I (High Sensitivity): <2 [KM]    Clinical Course User Index [KM] Rada Hay, MD     FINAL CLINICAL  IMPRESSION(S) / ED DIAGNOSES   Final diagnoses:  Shoulder pain, unspecified chronicity, unspecified laterality  Chest pain, unspecified type     Rx / DC Orders   ED Discharge Orders     None        Note:  This document was prepared  using Conservation officer, historic buildings and may include unintentional dictation errors.   Georga Hacking, MD 01/12/22 2302    Georga Hacking, MD 01/12/22 2306

## 2022-01-12 NOTE — ED Notes (Signed)
E signature pad not working. Pt educated on discharge instructions and verbalized understanding.  

## 2022-01-12 NOTE — Discharge Instructions (Signed)
Your cardiac work-up was reassuring including EKG and heart enzymes.  I suspect that your pain is related to muscle spasm.  You also might be having digestion causing the belching.  You can take Tylenol for pain.

## 2022-01-12 NOTE — ED Provider Triage Note (Signed)
  Emergency Medicine Provider Triage Evaluation Note  Rhonda Larson , a 36 y.o.female,  was evaluated in triage.  Pt complains of chest pain.  She states she is cooking when she started having left-sided chest pain and neck pain.  She states that she has been generally feeling unwell for the past few days.  She states that she has pain whenever she pushes on the left side of her chest.  In addition, she has been having increased belching/burping.  Denies any recent injuries or illnesses.   Review of Systems  Positive: Chest pain, fatigue, burping Negative: Denies fever, abdominal pain, vomiting  Physical Exam   Vitals:   01/12/22 1850  BP: 109/65  Pulse: 88  Resp: 18  Temp: 98 F (36.7 C)  SpO2: 99%   Gen:   Awake, appears exhausted. Resp:  Normal effort  MSK:   Moves extremities without difficulty  Other:    Medical Decision Making  Given the patient's initial medical screening exam, the following diagnostic evaluation has been ordered. The patient will be placed in the appropriate treatment space, once one is available, to complete the evaluation and treatment. I have discussed the plan of care with the patient and I have advised the patient that an ED physician or mid-level practitioner will reevaluate their condition after the test results have been received, as the results may give them additional insight into the type of treatment they may need.    Diagnostics: Labs, UA, respiratory panel, CXR, EKG  Treatments: none immediately   Teodoro Spray, [place] 01/12/22 1851

## 2022-01-12 NOTE — ED Triage Notes (Signed)
Pt to ED via POV from home. Pt reports she was cooking this and started having left sided CP, neck pain and generally feeling unwell. Pt also reports increased belching.

## 2022-01-29 IMAGING — US US PELVIS COMPLETE WITH TRANSVAGINAL
1 series · 13 of 25 positions shown · non-contrast
Comparison: None

CLINICAL DATA: 35-year-old female with irregular bleeding x1 month.

EXAM:
TRANSABDOMINAL AND TRANSVAGINAL ULTRASOUND OF PELVIS
TECHNIQUE: Both transabdominal and transvaginal ultrasound examinations of the
pelvis were performed. Transabdominal technique was performed for
global imaging of the pelvis including uterus, ovaries, adnexal
regions, and pelvic cul-de-sac. It was necessary to proceed with
endovaginal exam following the transabdominal exam to visualize the
endometrium and ovaries.

[Series 1: us pelvis (transabdominal only) · 13 of 55 slices shown]
[im 1/55]
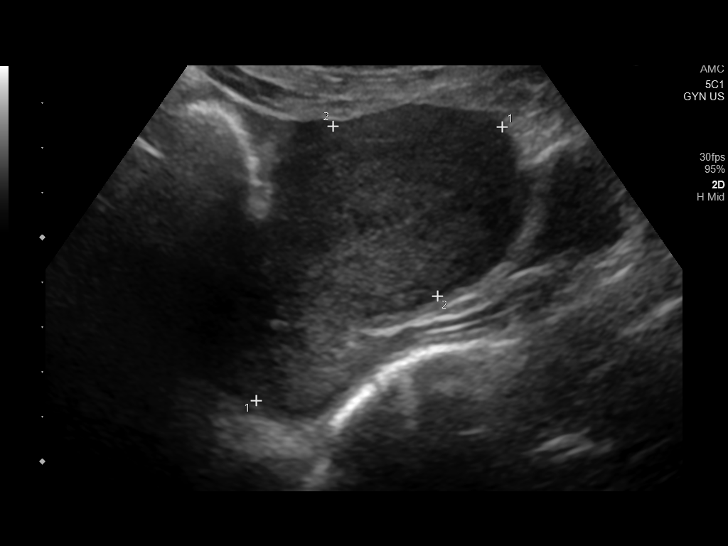
[im 5/55]
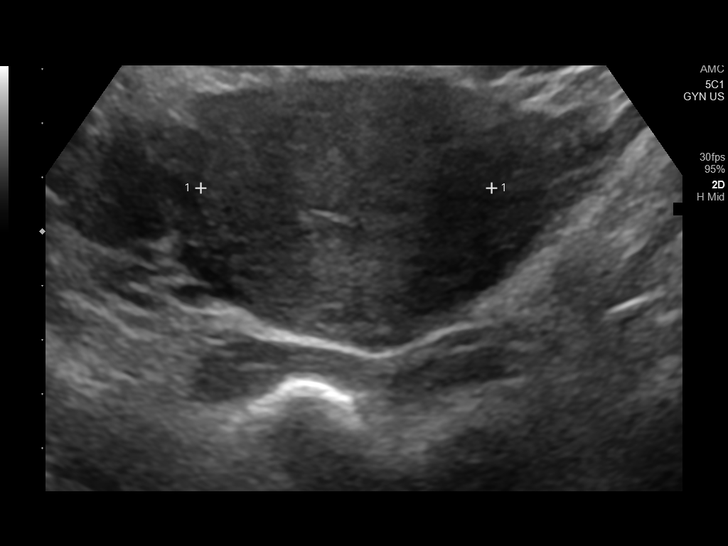
[im 10/55]
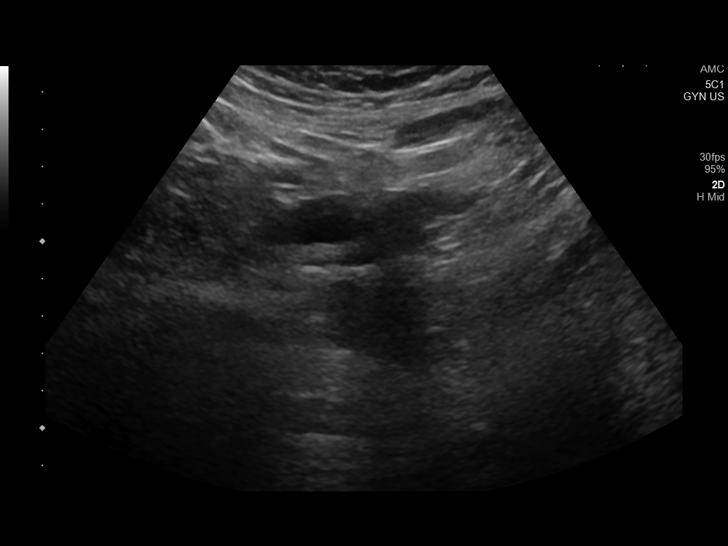
[im 14/55]
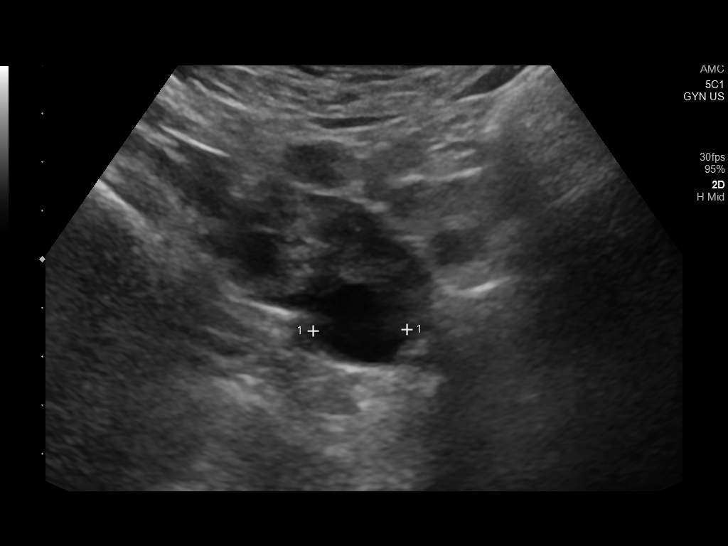
[im 19/55]
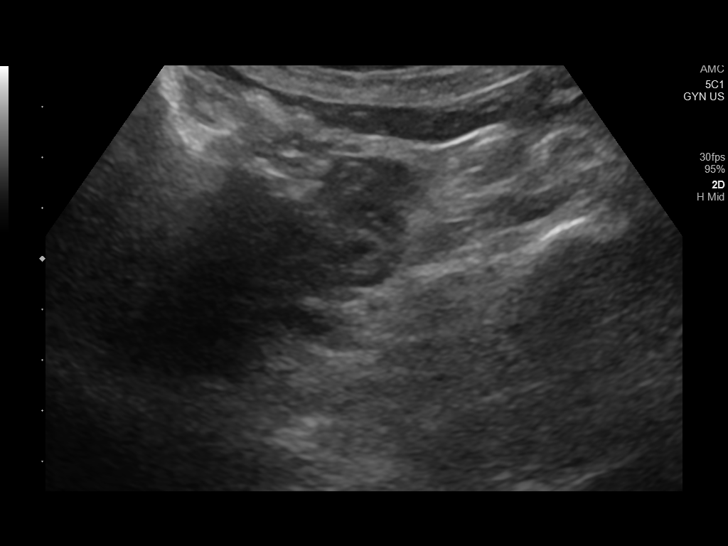
[im 23/55]
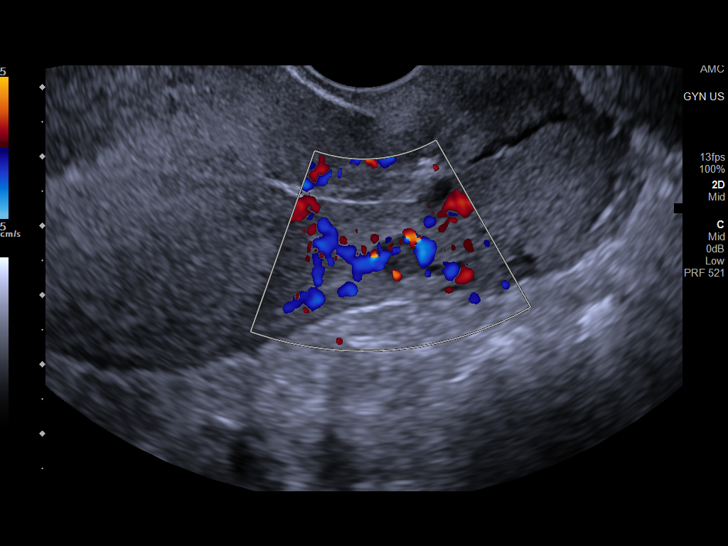
[im 28/55]
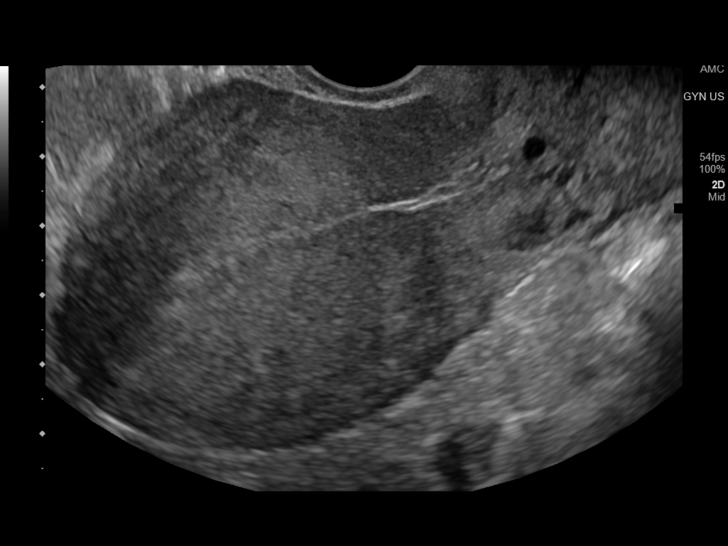
[im 32/55]
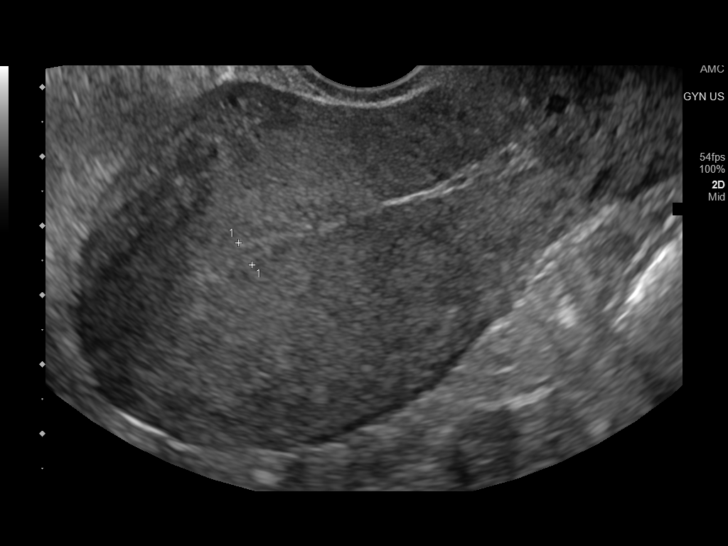
[im 37/55]
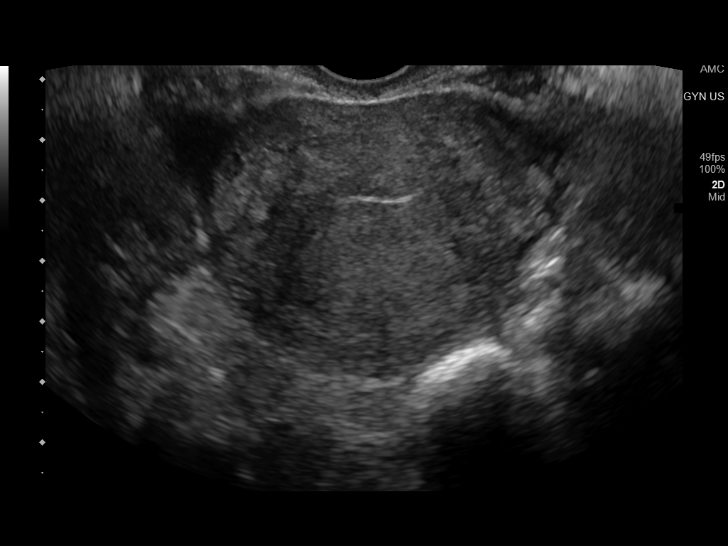
[im 41/55]
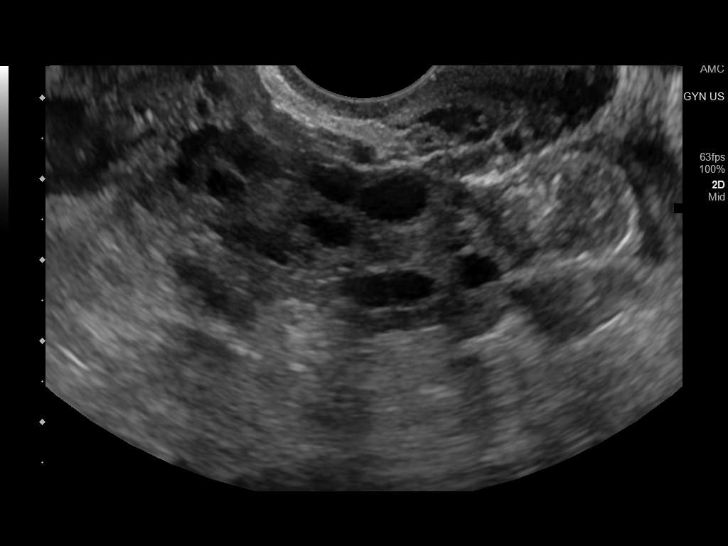
[im 46/55]
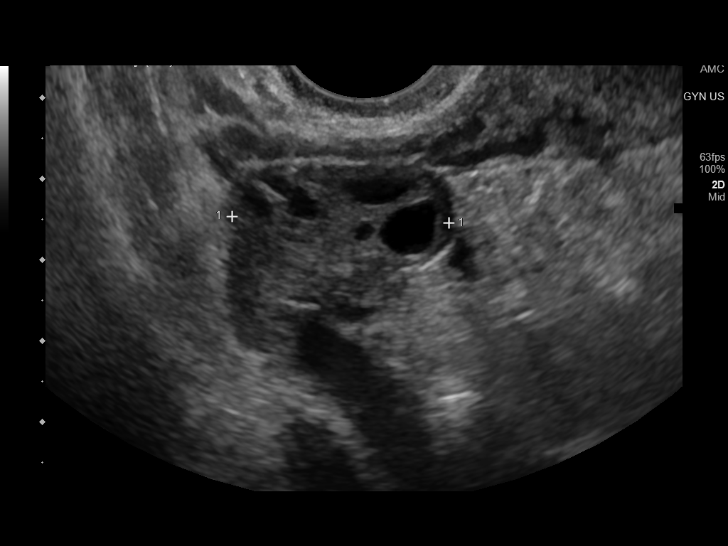
[im 50/55]
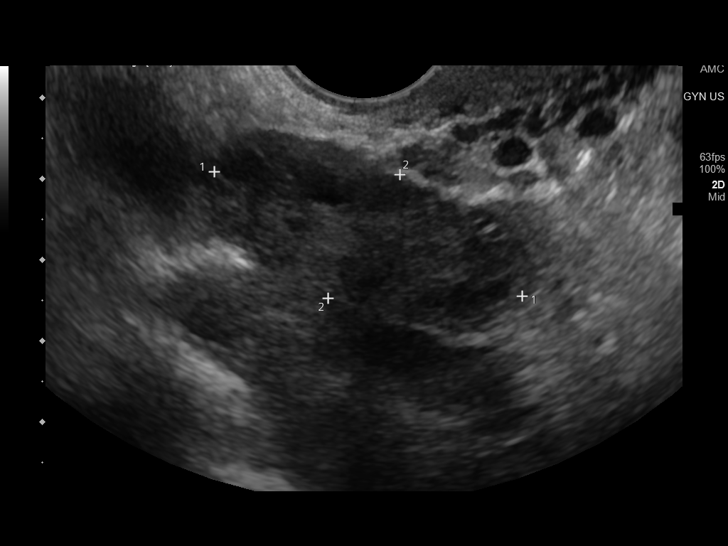
[im 55/55]
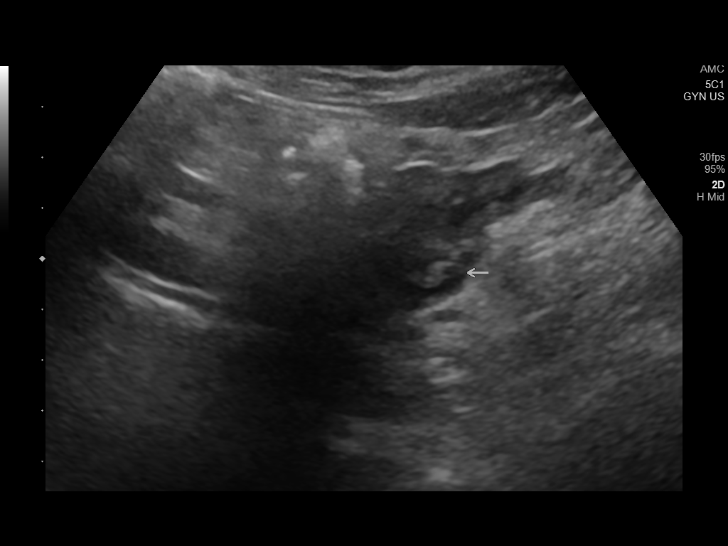

[13 of 25 positions shown; findings below may reference images not displayed]

FINDINGS: Uterus

Measurements: 9.7 x 4.7 x 6.5 cm = volume: 155 mL. The uterus
demonstrates a heterogeneous echotexture with findings suggestive of
adenomyosis. A 9 mm probable complex nabothian cyst in the lower
uterus.

Endometrium

Thickness: 4 mm. Slight hyperemia of the endometrium. No focal
lesion identified. Hysteroscopy or saline infused hysterosonography
may provide better evaluation if there is clinical concern for an
endometrial lesion such as a polyp.

Right ovary

Measurements: 4.2 x 1.8 x 2.7 cm = volume: 10.8 mL. Normal
appearance/no adnexal mass.

Left ovary

Measurements: 4.1 x 1.8 x 1.8 cm = volume: 7.3 mL. Normal
appearance/no adnexal mass.

Other findings

No abnormal free fluid.
IMPRESSION: 1. Heterogeneous uterus with findings suggestive of adenomyosis.
2. Slightly hyperemic endometrium without a focal mass.
3. Unremarkable ovaries.

## 2022-10-22 ENCOUNTER — Ambulatory Visit (LOCAL_COMMUNITY_HEALTH_CENTER): Payer: Self-pay | Admitting: Family Medicine

## 2022-10-22 ENCOUNTER — Encounter: Payer: Self-pay | Admitting: Family Medicine

## 2022-10-22 ENCOUNTER — Ambulatory Visit: Payer: Self-pay

## 2022-10-22 VITALS — BP 93/60 | HR 72 | Ht 63.0 in | Wt 145.0 lb

## 2022-10-22 DIAGNOSIS — Z01419 Encounter for gynecological examination (general) (routine) without abnormal findings: Secondary | ICD-10-CM

## 2022-10-22 DIAGNOSIS — Z113 Encounter for screening for infections with a predominantly sexual mode of transmission: Secondary | ICD-10-CM

## 2022-10-22 DIAGNOSIS — Z3009 Encounter for other general counseling and advice on contraception: Secondary | ICD-10-CM

## 2022-10-22 LAB — HM HIV SCREENING LAB: HM HIV Screening: NEGATIVE

## 2022-10-22 LAB — WET PREP FOR TRICH, YEAST, CLUE
Trichomonas Exam: NEGATIVE
Yeast Exam: NEGATIVE

## 2022-10-22 MED ORDER — DOXYCYCLINE HYCLATE 100 MG PO TABS: 100.0000 mg | ORAL_TABLET | Freq: Two times a day (BID) | ORAL | Status: DC

## 2022-10-22 NOTE — Progress Notes (Signed)
[facility] DEPARTMENT [facility] [address] Main Number: [phone]  Family Planning Visit- Repeat Yearly Visit  Subjective:  Rhonda Larson is a 37 y.o. G3P3003  being seen today for an annual wellness visit and to discuss contraception options.   The patient is currently using Hormonal Implant for pregnancy prevention. Patient does not want a pregnancy in the next year.    report they are looking for a method that provides High efficacy at preventing pregnancy   Patient has the following medical problems: has History of gestational diabetes 2018 on their problem list.  Chief Complaint  Patient presents with   Annual Exam    Patient reports to clinic for PE and wanted nexplanon removed. Discussed with patient that her nexplanon is good for another year- pt desires to keep device for another year  Patient denies concerns about self    See flowsheet for other program required questions.   Body mass index is 25.69 kg/m. - Patient is eligible for diabetes screening based on BMI> 25 and age >35?  no HA1C ordered? no  Patient reports 1 of partners in last year. Desires STI screening?  Yes   Has patient been screened once for HCV in the past?  No  No results found for: "HCVAB"  Does the patient have current of drug use, have a partner with drug use, and/or has been incarcerated since last result? No  If yes-- Screen for HCV through [facility]   Does the patient meet criteria for HBV testing? No  Criteria:  -Household, sexual or needle sharing contact with HBV -History of drug use -HIV positive -Those with known Hep C   Health Maintenance Due  Topic Date Due   HIV Screening  Never done   Hepatitis C Screening  Never done   PAP SMEAR-Modifier  Never done   COVID-19 Vaccine (1 - 2023-24 season) Never done   INFLUENZA VACCINE  10/14/2022    Review of Systems  Constitutional:  Negative for weight loss.  Eyes:   Negative for blurred vision.  Respiratory:  Negative for cough and shortness of breath.   Cardiovascular:  Negative for claudication.  Gastrointestinal:  Negative for nausea.  Genitourinary:  Negative for dysuria and frequency.  Skin:  Negative for rash.  Neurological:  Negative for headaches.  Endo/Heme/Allergies:  Does not bruise/bleed easily.    The following portions of the patient's history were reviewed and updated as appropriate: allergies, current medications, past family history, past medical history, past social history, past surgical history and problem list. Problem list updated.  Objective:   Vitals:   10/22/22 1531  BP: 93/60  Pulse: 72  Weight: 145 lb (65.8 kg)  Height: 5\' 3"  (1.6 m)    Physical Exam Vitals and nursing note reviewed. Exam conducted with a chaperone present.  Constitutional:      Appearance: Normal appearance.  HENT:     Head: Normocephalic and atraumatic.     Mouth/Throat:     Mouth: Mucous membranes are moist.     Pharynx: Oropharynx is clear. No oropharyngeal exudate or posterior oropharyngeal erythema.  Pulmonary:     Effort: Pulmonary effort is normal.  Chest:  Breasts:    Tanner Score is 5.     Right: Normal. No mass, nipple discharge, skin change or tenderness.     Left: Tenderness present. No mass, nipple discharge or skin change.       Comments: Tenderness with palp at red X Abdominal:  General: Abdomen is flat.     Palpations: There is no mass.     Tenderness: There is no abdominal tenderness. There is no rebound.  Genitourinary:    General: Normal vulva.     Exam position: Lithotomy position.     Pubic Area: No rash or pubic lice.      Labia:        Right: No rash or lesion.        Left: No rash or lesion.      Vagina: Normal. No vaginal discharge, erythema, bleeding or lesions.     Cervix: No cervical motion tenderness, discharge, friability, lesion or erythema.     Uterus: Normal.      Adnexa: Right adnexa normal  and left adnexa normal.     Rectum: Normal.  Lymphadenopathy:     Head:     Right side of head: No preauricular or posterior auricular adenopathy.     Left side of head: No preauricular or posterior auricular adenopathy.     Cervical: No cervical adenopathy.     Upper Body:     Right upper body: No supraclavicular, axillary or epitrochlear adenopathy.     Left upper body: No supraclavicular, axillary or epitrochlear adenopathy.     Lower Body: No right inguinal adenopathy. No left inguinal adenopathy.  Skin:    General: Skin is warm and dry.     Findings: No rash.  Neurological:     Mental Status: She is alert and oriented to person, place, and time.     Assessment and Plan:  Rhonda Larson is a 37 y.o. female G3P3003 presenting to the [facility] Department for an yearly wellness and contraception visit  1. Well woman exam with routine gynecological exam -CBE today (normal)- next due in 2 years --- mentioned some breast pain on left breast at 6 o'clock only with palpation- denies heavy caffeine use- encouraged vitamin E supplementation and to RTC with continued breast pain in the next 2-3 months -pap repeat today, no known hx of abnormal -c/o dizziness associated with work chemicals- reviewed wearing a mask- and possibly asking work further information about chemicals used   - IGP, Aptima HPV  2. Family planning Contraception counseling: Reviewed options based on patient desire and reproductive life plan. Patient is interested in Hormonal Implant. This was not provided to the patient today. Already in place   Risks, benefits, and typical effectiveness rates were reviewed.  Questions were answered.  Written information was also given to the patient to review.    The patient will follow up in  1 years for surveillance.  The patient was told to call with any further questions, or with any concerns about this method of contraception.  Emphasized use of condoms 100%  of the time for STI prevention.  Educated on ECP and assessed need for ECP. Not indicated  3. Screening for venereal disease  - HIV [facility] - Syphilis Serology, [facility] - WET PREP FOR TRICH, YEAST, CLUE - Chlamydia/Gonorrhea  Lab    Return in about 1 year (around 10/22/2023) for annual well-woman exam.  No future appointments. Due to language barrier, interpreter Estill Dooms was present for this visit.  Lenice Llamas, [place]

## 2022-10-22 NOTE — Progress Notes (Signed)
 Pt is here for family planning visit.  Family planning packet reviewed and given to pt.  Wet prep results reviewed, no treatment required per standing orders. Condoms declined. Gaspar Garbe, RN

## 2022-10-28 ENCOUNTER — Telehealth: Payer: Self-pay

## 2022-10-28 LAB — IGP, APTIMA HPV

## 2022-10-28 NOTE — Telephone Encounter (Signed)
-----   Message from [facility] sent at 10/28/2022  9:40 AM EDT ----- Needs repeat pap- please call patient and let her know that the sample wasn't good. She can make a return appointment for pap only

## 2022-10-28 NOTE — Telephone Encounter (Signed)
Pt notified of need for repeat Pap smear.   Repeat Pap smear scheduled for 8/23 @ 4 pm.  Berdie Ogren, RN

## 2022-11-05 ENCOUNTER — Ambulatory Visit: Payer: Self-pay

## 2022-11-26 ENCOUNTER — Ambulatory Visit (LOCAL_COMMUNITY_HEALTH_CENTER): Payer: Self-pay | Admitting: Family Medicine

## 2022-11-26 VITALS — BP 107/60 | HR 71 | Ht 63.0 in | Wt 148.2 lb

## 2022-11-26 DIAGNOSIS — Z124 Encounter for screening for malignant neoplasm of cervix: Secondary | ICD-10-CM

## 2022-11-26 DIAGNOSIS — Z3009 Encounter for other general counseling and advice on contraception: Secondary | ICD-10-CM

## 2022-11-26 NOTE — Progress Notes (Signed)
Pt is here for repeat Pap smear.  Condoms given.  Berdie Ogren, RN

## 2022-11-26 NOTE — Progress Notes (Signed)
Patient presents to clinic for repeat pap smear.  1. Encounter for Papanicolaou smear of cervix  - IGP, Aptima HPV  [facility] FNP-C

## 2024-01-27 ENCOUNTER — Ambulatory Visit: Payer: Self-pay

## 2024-01-27 VITALS — BP 116/60 | Ht 63.0 in | Wt 139.0 lb

## 2024-01-27 DIAGNOSIS — Z3009 Encounter for other general counseling and advice on contraception: Secondary | ICD-10-CM

## 2024-01-27 DIAGNOSIS — Z3046 Encounter for surveillance of implantable subdermal contraceptive: Secondary | ICD-10-CM

## 2024-01-27 NOTE — Progress Notes (Deleted)
 Pt is here for PE/Nexplanon  removal. Nexplanon  removed successfully from the Lt Arm by Damien Satchel, [facility]. And pt tolerated well to the removal process with no complications. Opportunity given to pt to ask questions for any clarifications. Questions Answered.Condoms given. Wilkie Drought, RN

## 2024-01-27 NOTE — Progress Notes (Signed)
 [facility] [address] [place] [address] Main phone: [phone]  Family Planning Visit - Repeat Yearly Visit  Subjective:  Rhonda Larson is a 38 y.o. G3P3003  being seen today for an annual wellness visit and to discuss contraception options. The patient is currently using hormonal implant for pregnancy prevention. Patient does not want a pregnancy in the next year.   Patient reports they would like her Nexplanon  removed and she would like to use condoms for birth control.  Patient has the following medical problems:  Patient Active Problem List   Diagnosis Date Noted   History of gestational diabetes 2018 09/07/2019   Chief Complaint  Patient presents with   Annual Exam    PE/Nexplanon  removal   HPI Patient reports desire for Nexplanon  removal, placed 2021. She does not want anything different for contraception. Will use condoms, may consider a different method in the future.  Pap in 2024 NILM. Declines STI testing today.  Patient denies other concerns. No breasts or vaginal concerns.  Review of Systems  All other systems reviewed and are negative.  See flowsheet for further details and programmatic requirements Hyperlink available at the top of the signed note in blue.  Flow sheet content below:  Pregnancy Intention Screening Does the patient want to become pregnant in the next year?: No Does the patient's partner want to become pregnant in the next year?: No Would the patient like to discuss contraceptive options today?: Yes Contraception History Past methods of contraception used by patient:: Hormonal Implant Adverse effects associated with Hormonal Implant: irregular bleeding Sexual History What age did you start your period?: 12 How often do you have your period?: irregular Date of last sex?: 01/27/24 Has the patient had unprotected sex within the last 5 days?: No Do you have sex  with men, women, both men and women?: Men only In the past 2 months how many partners have you had sex with?: 1 In the past 12 months, how many partners have you had sex with?: 1 Is it possible that any of your sex partners in the past 12 months had sex with someone else whild they were still in a sexual relationship with you?: No What ways do you have sex?: Vaginal Do you or your partner use condoms and/or dental dams every time you have vaginal, oral or anal sex?: Condoms only Do you douche?: No Date of last HIV test?: 10/22/22 Have you ever had an STD?: No Have any of your partners had an STD?: No Have you or your partner ever shot up drugs?: No Have any of your partners used drugs in the past?: No Have you or your partners exchanged money or drugs for sex?: No  Diabetes screening This patient is 38 y.o. with a BMI of Body mass index is 24.62 kg/m.SABRA  Is patient eligible for diabetes screening (age >35 and BMI >25)?  no  Was Hgb A1c ordered? no  STI screening Patient reports 1 of partners in last year.  Does this patient desire STI screening?  No - declines Declines all blood tests.  Cervical Cancer Screening  Result Date Procedure Results Follow-ups  11/26/2022 IGP, Aptima HPV DIAGNOSIS:: Comment Specimen adequacy:: Comment Clinician Provided ICD10: Comment Performed by:: Comment QC reviewed by:: Comment PAP Smear Comment: . Note:: Comment Test Methodology: Comment HPV Aptima: Negative   10/22/2022 IGP, Aptima HPV DIAGNOSIS:: Comment Specimen adequacy:: Comment Clinician Provided ICD10: Comment Performed by:: Comment QC reviewed by:: Comment  PAP Smear Comment: . Note:: Comment Test Methodology: CANCELED HPV Aptima: Negative     Health Maintenance Due  Topic Date Due   Hepatitis C Screening  Never done   Hepatitis B Vaccines 19-59 Average Risk (1 of 3 - 19+ 3-dose series) Never done   HPV VACCINES (1 - 3-dose SCDM series) Never done   Influenza Vaccine  10/14/2023    COVID-19 Vaccine (1 - 2025-26 season) Never done    The following portions of the patient's history were reviewed and updated as appropriate: allergies, current medications, past family history, past medical history, past social history, past surgical history and problem list. Problem list updated.  Objective:   Vitals:   01/27/24 1339  BP: 116/60  Weight: 139 lb (63 kg)  Height: 5' 3 (1.6 m)   Physical Exam Vitals and nursing note reviewed.  Constitutional:      Appearance: Normal appearance.  HENT:     Head: Normocephalic.     Mouth/Throat:     Mouth: Mucous membranes are moist.  Cardiovascular:     Rate and Rhythm: Normal rate.     Heart sounds: Normal heart sounds, S1 normal and S2 normal.  Pulmonary:     Effort: Pulmonary effort is normal.     Breath sounds: Normal breath sounds.  Abdominal:     General: Abdomen is flat.     Palpations: Abdomen is soft.     Tenderness: There is no abdominal tenderness.  Genitourinary:    Comments: No genital exam indicated Musculoskeletal:        General: Normal range of motion.  Lymphadenopathy:     Head:     Right side of head: No submandibular, preauricular or posterior auricular adenopathy.     Left side of head: No submandibular, preauricular or posterior auricular adenopathy.     Cervical: No cervical adenopathy.     Upper Body:     Right upper body: No supraclavicular or axillary adenopathy.     Left upper body: No supraclavicular or axillary adenopathy.  Skin:    General: Skin is warm and dry.  Neurological:     Mental Status: She is alert and oriented to person, place, and time.  Psychiatric:        Mood and Affect: Mood normal.    Assessment and Plan:  Rhonda Larson is a 38 y.o. female G3P3003 presenting to the [facility] Department for an yearly wellness and contraception visit  Family planning  Contraception counseling:  Reviewed options based on patient desire and reproductive life  plan. Patient is interested in Female Condom. This was provided to the patient today. Pamphlet provided to patient for consideration of other methods for the future.  Risks, benefits, and typical effectiveness rates were reviewed.  Questions were answered.  Written information was also given to the patient to review.    The patient will follow up in  1 years for surveillance.  The patient was told to call with any further questions, or with any concerns about this method of contraception.  Emphasized use of condoms 100% of the time for STI prevention.  Emergency Contraception Precautions (ECP): Patient assessed for need of ECP. She is not a candidate based on LARC in place and unexpired .   2. Nexplanon  removal  Procedure:  Nexplanon  Removal Patient identified, informed consent performed, consent signed.   Appropriate time out taken. Nexplanon  site identified in the patient's left arm.  Area prepped in usual sterile fashon. 1 ml of 1%  lidocaine with Epinephrine was used to anesthetize the area at the distal end of the implant and along implant site. A small stab incision was made right beside the implant on the distal portion.  The Nexplanon  rod was grasped using hemostats and removed without difficulty.  There was minimal blood loss. There were no complications.  Steri-strips were applied over the small incision.  A pressure bandage was applied to reduce any bruising.  The patient tolerated the procedure well and was given post procedure instructions.     Nexplanon :   Counseled patient to take OTC analgesic starting as soon as lidocaine starts to wear off and take regularly for at least 48 hr to decrease discomfort.  Specifically to take with food or milk to decrease stomach upset and for IB 600 mg (3 tablets) every 6 hrs; IB 800 mg (4 tablets) every 8 hrs; or Aleve 2 tablets every 12 hrs.  Due to language barrier, a Spanish interpreter Kemp T.) was present in person during the history-taking,  subsequent discussion, and physical exam with this patient.    Return in about 1 year (around 01/26/2025).  No future appointments.  Damien FORBES Satchel, NP

## 2024-01-27 NOTE — Progress Notes (Addendum)
 Pt is here for PE/Nexplanon  removal.Nexplanon  removed successfully from the Lt Arm by Damien Satchel, [facility]. And pt tolerated well to the removal process with no complications. Opportunity given to pt to ask questions for any clarifications. Questions answered.Condoms and FP card given. Wilkie Drought, RN.

## 2024-01-30 ENCOUNTER — Emergency Department: Payer: Self-pay

## 2024-01-30 ENCOUNTER — Other Ambulatory Visit: Payer: Self-pay

## 2024-01-30 ENCOUNTER — Emergency Department
Admission: EM | Admit: 2024-01-30 | Discharge: 2024-01-30 | Disposition: A | Discharge status: Home / Self Care | Payer: Self-pay | Attending: Emergency Medicine | Admitting: Emergency Medicine

## 2024-01-30 DIAGNOSIS — R1084 Generalized abdominal pain: Secondary | ICD-10-CM | POA: Insufficient documentation

## 2024-01-30 DIAGNOSIS — M549 Dorsalgia, unspecified: Secondary | ICD-10-CM | POA: Insufficient documentation

## 2024-01-30 DIAGNOSIS — R112 Nausea with vomiting, unspecified: Secondary | ICD-10-CM | POA: Insufficient documentation

## 2024-01-30 LAB — COMPREHENSIVE METABOLIC PANEL WITH GFR
ALT: 6 U/L (ref 0–44)
AST: 16 U/L (ref 15–41)
Albumin: 4 g/dL (ref 3.5–5.0)
Alkaline Phosphatase: 97 U/L (ref 38–126)
Anion gap: 9 (ref 5–15)
BUN: 9 mg/dL (ref 6–20)
CO2: 24 mmol/L (ref 22–32)
Calcium: 8.3 mg/dL — ABNORMAL LOW (ref 8.9–10.3)
Chloride: 104 mmol/L (ref 98–111)
Creatinine, Ser: 0.52 mg/dL (ref 0.44–1.00)
GFR, Estimated: >60 mL/min (ref >60)
Glucose, Bld: 102 mg/dL — ABNORMAL HIGH (ref 70–99)
Potassium: 3.7 mmol/L (ref 3.5–5.1)
Sodium: 137 mmol/L (ref 135–145)
Total Bilirubin: <0.2 mg/dL (ref 0.0–1.2)
Total Protein: 6.6 g/dL (ref 6.5–8.1)

## 2024-01-30 LAB — CBC
HCT: 33.5 % — ABNORMAL LOW (ref 36.0–46.0)
Hemoglobin: 10.4 g/dL — ABNORMAL LOW (ref 12.0–15.0)
MCH: 26.1 pg (ref 26.0–34.0)
MCHC: 31 g/dL (ref 30.0–36.0)
MCV: 84.2 fL (ref 80.0–100.0)
Platelets: 252 K/uL (ref 150–400)
RBC: 3.98 MIL/uL (ref 3.87–5.11)
RDW: 15 % (ref 11.5–15.5)
WBC: 16 K/uL — ABNORMAL HIGH (ref 4.0–10.5)
nRBC: 0 % (ref 0.0–0.2)

## 2024-01-30 LAB — URINALYSIS, ROUTINE W REFLEX MICROSCOPIC
Bilirubin Urine: NEGATIVE
Glucose, UA: NEGATIVE mg/dL
Hgb urine dipstick: NEGATIVE
Ketones, ur: NEGATIVE mg/dL
Nitrite: NEGATIVE
Protein, ur: 30 mg/dL — AB
Specific Gravity, Urine: 1.025 (ref 1.005–1.030)
pH: 7 (ref 5.0–8.0)

## 2024-01-30 LAB — LIPASE, BLOOD: Lipase: 28 U/L (ref 11–51)

## 2024-01-30 LAB — POC URINE PREG, ED: Preg Test, Ur: NEGATIVE

## 2024-01-30 MED ORDER — MORPHINE SULFATE (PF) 4 MG/ML IV SOLN
4.0000 mg | Freq: Once | INTRAVENOUS | Status: AC
  Administered 2024-01-30: 4 mg via INTRAVENOUS
  Filled 2024-01-30: qty 1

## 2024-01-30 MED ORDER — ONDANSETRON HCL 4 MG/2ML IJ SOLN
4.0000 mg | Freq: Once | INTRAMUSCULAR | Status: AC
  Administered 2024-01-30: 4 mg via INTRAVENOUS
  Filled 2024-01-30: qty 2

## 2024-01-30 MED ORDER — ONDANSETRON 4 MG PO TBDP: 4.0000 mg | ORAL_TABLET | Freq: Three times a day (TID) | ORAL | 0 refills | Status: AC | PRN

## 2024-01-30 MED ORDER — PANTOPRAZOLE SODIUM 40 MG IV SOLR
40.0000 mg | Freq: Once | INTRAVENOUS | Status: AC
  Administered 2024-01-30: 40 mg via INTRAVENOUS
  Filled 2024-01-30: qty 10

## 2024-01-30 MED ORDER — DICYCLOMINE HCL 10 MG PO CAPS: 10.0000 mg | ORAL_CAPSULE | Freq: Three times a day (TID) | ORAL | 0 refills | Status: AC

## 2024-01-30 MED ORDER — SODIUM CHLORIDE 0.9 % IV SOLN: Freq: Once | INTRAVENOUS | Status: AC

## 2024-01-30 MED ORDER — IOHEXOL 300 MG/ML  SOLN
100.0000 mL | Freq: Once | INTRAMUSCULAR | Status: AC | PRN
  Administered 2024-01-30: 100 mL via INTRAVENOUS

## 2024-01-30 NOTE — ED Notes (Signed)
 See triage note  Presents with family  Per family she developed epigastric pain with n/v at 4 am States pain moves into her back  Low grade temp on arrival

## 2024-01-30 NOTE — ED Triage Notes (Addendum)
 Pt arrives via POV with c/o abd pain in the epigastric area that started around 0400 and the pain is a 10/10 that is intense and spreading to their back. Pt has had 5 episodes of emesis that were green in color. Translator was used during triage, and pt was slow/unwilling to answer questions.

## 2024-01-30 NOTE — ED Provider Notes (Signed)
 [facility] Provider Note    Event Date/Time   First MD Initiated Contact with Patient 01/30/24 [phone]     (approximate)   History   Abdominal Pain  Spanish interpreter used HPI  Rhonda Larson is a 38 y.o. female with history of cholecystectomy who presents with complaints of abdominal pain.  She describes pain in her epigastrium started around 4 AM this morning, describes 10 out of 10 pain.  She does have pain in her back as well.  Multiple episodes of vomiting with nausea.  No diarrhea reported     Physical Exam   Triage Vital Signs: ED Triage Vitals  Encounter Vitals Group     BP 01/30/24 0907 92/75     Girls Systolic BP Percentile --      Girls Diastolic BP Percentile --      Boys Systolic BP Percentile --      Boys Diastolic BP Percentile --      Pulse Rate 01/30/24 0907 72     Resp 01/30/24 0907 17     Temp 01/30/24 0907 99.3 F (37.4 C)     Temp Source 01/30/24 0907 Oral     SpO2 01/30/24 0907 100 %     Weight 01/30/24 0911 68 kg (150 lb)     Height 01/30/24 0911 1.53 m (5' 0.24)     Head Circumference --      Peak Flow --      Pain Score 01/30/24 0909 10     Pain Loc --      Pain Education --      Exclude from Growth Chart --     Most recent vital signs: Vitals:   01/30/24 0907 01/30/24 1148  BP: 92/75 101/70  Pulse: 72 70  Resp: 17 16  Temp: 99.3 F (37.4 C) 98.9 F (37.2 C)  SpO2: 100% 100%     General: Awake,  CV:  Good peripheral perfusion.  Resp:  Normal effort.  Abd:  No distention.  Tenderness in the epigastrium, normal pulses in the upper and lower extremities, no pulsatile mass Other:     ED Results / Procedures / Treatments   Labs (all labs ordered are listed, but only abnormal results are displayed) Labs Reviewed  COMPREHENSIVE METABOLIC PANEL WITH GFR - Abnormal; Notable for the following components:      Result Value   Glucose, Bld 102 (*)    Calcium 8.3 (*)    All other components within  normal limits  CBC - Abnormal; Notable for the following components:   WBC 16.0 (*)    Hemoglobin 10.4 (*)    HCT 33.5 (*)    All other components within normal limits  URINALYSIS, ROUTINE W REFLEX MICROSCOPIC - Abnormal; Notable for the following components:   Color, Urine YELLOW (*)    APPearance HAZY (*)    Protein, ur 30 (*)    Leukocytes,Ua SMALL (*)    Bacteria, UA MANY (*)    Non Squamous Epithelial PRESENT (*)    All other components within normal limits  LIPASE, BLOOD  POC URINE PREG, ED     EKG     RADIOLOGY CT abdomen pelvis without acute abnormality, discussed simple ovarian cyst with the patient    PROCEDURES:  Critical Care performed:   Procedures   MEDICATIONS ORDERED IN ED: Medications  pantoprazole (PROTONIX) injection 40 mg (40 mg Intravenous Given 01/30/24 0945)  morphine (PF) 4 MG/ML injection 4 mg (4 mg Intravenous Given  01/30/24 0944)  ondansetron  (ZOFRAN ) injection 4 mg (4 mg Intravenous Given 01/30/24 0944)  0.9 %  sodium chloride  infusion ( Intravenous New Bag/Given 01/30/24 0945)  iohexol (OMNIPAQUE) 300 MG/ML solution 100 mL (100 mLs Intravenous Contrast Given 01/30/24 1131)     IMPRESSION / MDM / ASSESSMENT AND PLAN / ED COURSE  I reviewed the triage vital signs and the nursing notes. Patient's presentation is most consistent with acute presentation with potential threat to life or bodily function.  Patient presents with epigastric pain as detailed above, differential includes gastritis/PUD, pancreatitis.  Not consistent with ACS, history of cholecystectomy  Will obtain labs, treat with IV morphine, IV Zofran , IV Protonix  ----------------------------------------- 2:30 PM on 01/30/2024 ----------------------------------------- Lab work notable for elevated white blood cell count, urinalysis not consistent with urinary tract infection  CT scan does not demonstrate any acute abnormalities, discussed simple ovarian cyst with the  patient, she does not have any pain in the lower abdomen  On reassessment she reports all of her pain is resolved, given this no indication for admission, appropriate for discharge at this time, she agrees to this plan, strict return precautions      FINAL CLINICAL IMPRESSION(S) / ED DIAGNOSES   Final diagnoses:  Generalized abdominal pain     Rx / DC Orders   ED Discharge Orders          Ordered    dicyclomine (BENTYL) 10 MG capsule  3 times daily before meals & bedtime        01/30/24 1410    ondansetron  (ZOFRAN -ODT) 4 MG disintegrating tablet  Every 8 hours PRN        01/30/24 1410             Note:  This document was prepared using Dragon voice recognition software and may include unintentional dictation errors.   Arlander Charleston, MD 01/30/24 [phone]

## 2024-02-03 ENCOUNTER — Ambulatory Visit: Payer: Self-pay | Admitting: Family Medicine

## 2024-02-03 VITALS — BP 110/68 | HR 65 | Temp 97.8°F | Wt 140.0 lb

## 2024-02-03 DIAGNOSIS — L089 Local infection of the skin and subcutaneous tissue, unspecified: Secondary | ICD-10-CM | POA: Insufficient documentation

## 2024-02-03 DIAGNOSIS — Z3009 Encounter for other general counseling and advice on contraception: Secondary | ICD-10-CM

## 2024-02-03 MED ORDER — CEPHALEXIN 500 MG PO CAPS: 500.0000 mg | ORAL_CAPSULE | Freq: Four times a day (QID) | ORAL | 0 refills | Status: AC

## 2024-02-03 NOTE — Progress Notes (Unsigned)
 [facility] [address] [place] [address] Main phone: [phone]  Women's Health Problem Visit   Subjective:  Rhonda Larson is a 38 y.o. being seen today for arm discomfort, swelling, and rash after Nexplanon  removal one week ago on 01/27/2024.   Chief Complaint  Patient presents with   Acute Visit    Arm check    HPI Patient had her Nexplanon  removed one week ago, 01/27/2024. Subsequently developed small rash Tuesday. Some arm pain, intermittently. Main symptom is itching. No discharge or warmth to touch over the area. No fever, chills, malaise.   Was seen in ED 11/17 for generalized abdominal and back pain with nausea and vomiting. CT abd/pelvis unremarkable. Treated with IV fluids, protonix , morphine . Patient reports her pain subsided, no residual pain. She thinks it is unrelated.   Does the patient have a current or past history of drug use? No   No components found for: HCV]  Health Maintenance Due  Topic Date Due   Hepatitis C Screening  Never done   Hepatitis B Vaccines 19-59 Average Risk (1 of 3 - 19+ 3-dose series) Never done   HPV VACCINES (1 - 3-dose SCDM series) Never done   Influenza Vaccine  10/14/2023   COVID-19 Vaccine (1 - 2025-26 season) Never done   Review of Systems  Constitutional:  Negative for fever, malaise/fatigue and weight loss.  Respiratory:  Negative for shortness of breath.   Cardiovascular:  Negative for chest pain and palpitations.  Skin:  Positive for rash.   The following portions of the patient's history were reviewed and updated as appropriate: allergies, current medications, past family history, past medical history, past social history, past surgical history and problem list. Problem list updated.  See flowsheet for other program required questions.  Objective:   Vitals:   02/03/24 1418  BP: 110/68  Pulse: 65  Temp: 97.8 F (36.6 C)  TempSrc:  Oral  Weight: 140 lb (63.5 kg)   Physical Exam Constitutional:      Appearance: Normal appearance.  HENT:     Head: Normocephalic and atraumatic.  Pulmonary:     Effort: Pulmonary effort is normal.  Musculoskeletal:        General: Normal range of motion.  Skin:    General: Skin is warm and dry.     Capillary Refill: Capillary refill takes less than 2 seconds.     Findings: Rash present. Rash is nodular.     Comments: Scattered pruritic nodular rash over area of koband placement. Erythematous papular rash under area of steri-strip usage. No warmth, drainage, swelling, or streaking. See photos.    Neurological:     General: No focal deficit present.     Mental Status: She is alert.  Psychiatric:        Mood and Affect: Mood normal.        Behavior: Behavior normal.        Assessment and Plan:  Rhonda Larson is a 37 y.o. female presenting to the [facility] Department for a Women's Health problem visit  Skin reaction of left arm Assessment & Plan: S/p Nexplanon  removal last week on 01/27/24. No red flags. Appears to be contact dermatitis, likely from adhesive used in steri-strips (worst reaction area) and koband (scattered nodular area). Discussed extensively with patient. Wound itself from Nexplanon  removal healing nicely without evidence of dehiscence. Recommend washing with gentle unscented soap, using benadryl or hydrocortisone 1% cream Bid for 3-5 days,  may stop when improved. Reassured against infection; however, with holiday coming up provided script of keflex  for patient to fill if she developed s/s infection; discussed in depth with patient and provided details in AVS. Return precautions given.   Orders: -     Cephalexin ; Take 1 capsule (500 mg total) by mouth 4 (four) times daily for 5 days.  Dispense: 20 capsule; Refill: 0   No follow-ups on file.  No future appointments.  Betsey CHRISTELLA Helling, MD

## 2024-02-03 NOTE — Patient Instructions (Addendum)
 I translated the following text using Google translate.  Please excuse any errors.  He traducido el siguiente texto con el traductor de Microbiologist. Disculpe cualquier error.   Your arm looks like you have contact dermatitis.  I suspect you are allergic to the adhesive in the bandage strips and stretchy coband bandage we used after your procedure last week.  Tu brazo parece tener dermatitis de contacto. Sospecho que eres alrgico al adhesivo de las tiras de vendaje y del vendaje elstico que usamos despus de tu procedimiento la semana pasada.  Washing:  - shower like normal - you do not need to cover the area - wash it gently with soap and water - use a gentle soap with no fragrances or dyes  Lavado:  - Dchese como de costumbre. - No es necesario cubrir la zona. - Lvela suavemente con agua y jabn. - Use un jabn suave sin fragancias ni colorantes.  Rash:  - since the wound is closed, it is now safe to use creams over it - use EITHER benadryl cream OR hydrocortisone 1% cream over the affected area twice daily - use cream twice daily for 3 to 5 days, or until the rash is resolved Erupcin:  - Como la herida est cerrada, ya puede aplicar cremas. - Aplique crema de difenhidramina o crema de hidrocortisona al 1% sobre la zona motorola al da. - Aplique la crema dos veces al da durante 3 a 5 das, o hasta que la erupcin desaparezca.  Possible infection:  - if you develop signs of infection (fever, chills, redness or the arm, swelling of the wound, drainage or fluid out of the wound, red streaks going up or down your arm), please start the antibiotic - take the paper antibiotic prescription to the pharmacy of your choice to have it filled by a pharmacist Posible infeccin: - Si presenta signos de infeccin (fiebre, escalofros, enrojecimiento del brazo, hinchazn de la herida, supuracin o lquido de la herida, estras rojas en el brazo), comience a tomar el antibitico. -  Lleve la receta del antibitico a la farmacia de su preferencia para que un farmacutico la dispense.

## 2024-02-03 NOTE — Progress Notes (Unsigned)
 Pt is here today for Lt arm check in regards to IUD removal site. Prescription of Cephalexin   1 capsule(500 mg total) by mouth 4x daily for 5 days given to pt by Provider to obtain from the pharmacy. Provider examined site and changed  dressing. Wilkie Drought, RN.

## 2024-02-07 NOTE — Assessment & Plan Note (Signed)
 S/p Nexplanon  removal last week on 01/27/24. No red flags. Appears to be contact dermatitis, likely from adhesive used in steri-strips (worst reaction area) and koband (scattered nodular area). Discussed extensively with patient. Wound itself from Nexplanon  removal healing nicely without evidence of dehiscence. Recommend washing with gentle unscented soap, using benadryl or hydrocortisone 1% cream Bid for 3-5 days, may stop when improved. Reassured against infection; however, with holiday coming up provided script of keflex  for patient to fill if she developed s/s infection; discussed in depth with patient and provided details in AVS. Return precautions given.
